# Patient Record
Sex: Female | Born: 1995 | Race: Black or African American | Hispanic: No | Marital: Single | Smoking: Never smoker
Health system: Southern US, Community
[De-identification: ages and names within clinical notes are randomized; demographics above are authoritative.]

## PROBLEM LIST (undated history)

## (undated) ENCOUNTER — Ambulatory Visit: Admission: EM

## (undated) DIAGNOSIS — N946 Dysmenorrhea, unspecified: Secondary | ICD-10-CM

## (undated) HISTORY — PX: NO PAST SURGERIES: SHX2092

## (undated) HISTORY — DX: Dysmenorrhea, unspecified: N94.6

---
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
## 2015-01-07 ENCOUNTER — Emergency Department: Payer: Self-pay | Admitting: Emergency Medicine

## 2015-08-05 ENCOUNTER — Emergency Department: Payer: 59

## 2015-08-05 ENCOUNTER — Encounter: Payer: Self-pay | Admitting: Emergency Medicine

## 2015-08-05 ENCOUNTER — Emergency Department
Admission: EM | Admit: 2015-08-05 | Discharge: 2015-08-05 | Disposition: A | Discharge status: Home / Self Care | Payer: 59 | Attending: Emergency Medicine | Admitting: Emergency Medicine

## 2015-08-05 DIAGNOSIS — M25522 Pain in left elbow: Secondary | ICD-10-CM | POA: Insufficient documentation

## 2015-08-05 DIAGNOSIS — M7712 Lateral epicondylitis, left elbow: Secondary | ICD-10-CM | POA: Insufficient documentation

## 2015-08-05 MED ORDER — MELOXICAM 15 MG PO TABS: 15.0000 mg | ORAL_TABLET | Freq: Every day | ORAL | Status: DC | PRN

## 2015-08-05 NOTE — ED Provider Notes (Signed)
[facility] Emergency Department Provider Note  ____________________________________________  Time seen: Approximately 4:32 PM  I have reviewed the triage vital signs and the nursing notes.   HISTORY  Chief Complaint Arm Pain    HPI Barbara Chapman is a 19 y.o. female presents to the ER for the complaints of left elbow pain. Patient states the elbow has been bothering her intermittently times several weeks but states 2 days increased pain. Patient states the pain is only with movement. Patient states that pain causes her arm to feel like it is tight and hard to straighten. Patient states that she started noticing the last few weeks while at work. Patient states that she works and puts multiple things together with frequent elbow bending and wrist bending and movement throughout the day. Denies fall or known injury. Reports she still has full range of motion but pain with movement. Denies numbness or tingling sensation. States pain is currently 5 out of 10. Aching and throbbing.   History reviewed. No pertinent past medical history.  There are no active problems to display for this patient.   History reviewed. No pertinent past surgical history.  No current outpatient prescriptions on file.  Allergies Review of patient's allergies indicates no known allergies.  No family history on file.  Social History Social History  Substance Use Topics  . Smoking status: Never Smoker   . Smokeless tobacco: None  . Alcohol Use: No    Review of Systems Constitutional: No fever/chills Eyes: No visual changes. ENT: No sore throat. Cardiovascular: Denies chest pain. Respiratory: Denies shortness of breath. Gastrointestinal: No abdominal pain.  No nausea, no vomiting.  No diarrhea.  No constipation. Genitourinary: Negative for dysuria. Musculoskeletal: Negative for back pain. Positive for left elbow pain. Skin: Negative for rash. Neurological: Negative for  headaches, focal weakness or numbness.  10-point ROS otherwise negative.  ____________________________________________   PHYSICAL EXAM:  VITAL SIGNS: ED Triage Vitals  Enc Vitals Group     BP 08/05/15 1620 108/58 mmHg     Pulse Rate 08/05/15 1620 77     Resp 08/05/15 1620 16     Temp 08/05/15 1620 97.8 F (36.6 C)     Temp Source 08/05/15 1620 Oral     SpO2 08/05/15 1620 99 %     Weight 08/05/15 1620 145 lb (65.772 kg)     Height 08/05/15 1620  (1.626 m)     Head Cir --      Peak Flow --      Pain Score 08/05/15 1617 8     Pain Loc --      Pain Edu? --      Excl. in GC? --     Constitutional: Alert and oriented. Well appearing and in no acute distress. Eyes: Conjunctivae are normal. PERRL. EOMI. Head: Atraumatic.  Nose: No congestion/rhinnorhea.  Mouth/Throat: Mucous membranes are moist.  Oropharynx non-erythematous. Neck: No stridor.  No cervical spine tenderness to palpation. Hematological/Lymphatic/Immunilogical: No cervical lymphadenopathy. Cardiovascular: Normal rate, regular rhythm. Grossly normal heart sounds.  Good peripheral circulation. Respiratory: Normal respiratory effort.  No retractions. Lungs CTAB. Gastrointestinal: Soft and nontender. No distention. Normal Bowel sounds.  Musculoskeletal: No lower or upper extremity tenderness nor edema.  No joint effusions. Bilateral pedal pulses equal and easily palpated.  Except: left lateral elbow mild to mod TTP with localized tenderness over the lateral epicondyle, and pain with resisted wrist extension with elbow fully extended.  No swelling or ecchymosis, skin intact. Full ROM.  No pain above elbow, no pain below elbow. Bilateral handgrips equal. Sensation and motor to bilateral upper extremities equal and intact. Neurologic:  Normal speech and language. No gross focal neurologic deficits are appreciated. No gait instability. Skin:  Skin is warm, dry and intact. No rash noted. Psychiatric: Mood and affect are  normal. Speech and behavior are normal.  ____________________________________________   LABS (all labs ordered are listed, but only abnormal results are displayed)  Labs Reviewed - No data to display  RADIOLOGY   EXAM: LEFT ELBOW - COMPLETE 3+ VIEW  COMPARISON: None.  FINDINGS: Four views of the left elbow submitted. No acute fracture or subluxation. No posterior fat pad sign. No radiopaque foreign body.  IMPRESSION: Negative.   Electronically Signed By: Natasha Mead M.D. On: 08/05/2015 17:04  I, Renford Dills, personally viewed and evaluated these images (plain radiographs) as part of my medical decision making.   ____________________________________________   PROCEDURES  Procedure(s) performed:  SPLINT APPLICATION Date/Time: 5:21 PM Authorized by: Renford Dills Consent: Verbal consent obtained. Risks and benefits: risks, benefits and alternatives were discussed Consent given by: patient Splint applied by: edtechnician Location details: left sling Post-procedure: The splinted body part was neurovascularly unchanged following the procedure. Patient tolerance: Patient tolerated the procedure well with no immediate complications.    _______________________   INITIAL IMPRESSION / ASSESSMENT AND PLAN / ED COURSE  Pertinent labs & imaging results that were available during my care of the patient were reviewed by me and considered in my medical decision making (see chart for details).  Very well-appearing patient. No acute distress. Presents to the ER for complaints of left elbow pain. Patient works in a job that requires frequent elbow and forearm movement. Suspect overuse injury. Suspect left lateral epicondylitis. Discussed rest. Ice. Will treat with oral Mobic. Discussed follow-up with orthopedic as needed for continued pain. Discussed return parameters. Patient verbalized understanding and agreed to  plan. ____________________________________________   FINAL CLINICAL IMPRESSION(S) / ED DIAGNOSES  Final diagnoses:  Left elbow pain  Lateral epicondylitis (tennis elbow), left       Renford Dills, NP 08/05/15 1721  Emily Filbert, MD 08/06/15 1400

## 2015-08-05 NOTE — ED Notes (Signed)
Pt comes into the ED c/o left arm pain.  Patient states that she cannot straighten her arm out all the way without the sharp pains shooting through.  Denies any knowledge of injury to the arm.

## 2015-08-05 NOTE — Discharge Instructions (Signed)
Take medication as prescribed. Avoid overly strenuous activity. Stretch arm frequently. Wear sling as needed for support and rest. Alternate heat and ice for comfort.  Follow-up with orthopedic as needed for continued pain. See above. Return to the ER for new or worsening concerns.   Lateral Epicondylitis (Tennis Elbow) with Rehab Lateral epicondylitis involves inflammation and pain around the outer portion of the elbow. The pain is caused by inflammation of the tendons in the forearm that bring back (extend) the wrist. Lateral epicondylitis is also called tennis elbow, because it is very common in tennis players. However, it may occur in any individual who extends the wrist repetitively. If lateral epicondylitis is left untreated, it may become a chronic problem. SYMPTOMS   Pain, tenderness, and inflammation on the outer (lateral) side of the elbow.  Pain or weakness with gripping activities.  Pain that increases with wrist-twisting motions (playing tennis, using a screwdriver, opening a door or a jar).  Pain with lifting objects, including a coffee cup. CAUSES  Lateral epicondylitis is caused by inflammation of the tendons that extend the wrist. Causes of injury may include:  Repetitive stress and strain on the muscles and tendons that extend the wrist.  Sudden change in activity level or intensity.  Incorrect grip in racquet sports.  Incorrect grip size of racquet (often too large).  Incorrect hitting position or technique (usually backhand, leading with the elbow).  Using a racket that is too heavy. RISK INCREASES WITH:  Sports or occupations that require repetitive and/or strenuous forearm and wrist movements (tennis, squash, racquetball, carpentry).  Poor wrist and forearm strength and flexibility.  Failure to warm up properly before activity.  Resuming activity before healing, rehabilitation, and conditioning are complete. PREVENTION   Warm up and stretch properly  before activity.  Maintain physical fitness:  Strength, flexibility, and endurance.  Cardiovascular fitness.  Wear and use properly fitted equipment.  Learn and use proper technique and have a coach correct improper technique.  Wear a tennis elbow (counterforce) brace. PROGNOSIS  The course of this condition depends on the degree of the injury. If treated properly, acute cases (symptoms lasting less than 4 weeks) are often resolved in 2 to 6 weeks. Chronic (longer lasting cases) often resolve in 3 to 6 months but may require physical therapy. RELATED COMPLICATIONS   Frequently recurring symptoms, resulting in a chronic problem. Properly treating the problem the first time decreases frequency of recurrence.  Chronic inflammation, scarring tendon degeneration, and partial tendon tear, requiring surgery.  Delayed healing or resolution of symptoms. TREATMENT  Treatment first involves the use of ice and medicine to reduce pain and inflammation. Strengthening and stretching exercises may help reduce discomfort if performed regularly. These exercises may be performed at home if the condition is an acute injury. Chronic cases may require a referral to a physical therapist for evaluation and treatment. Your caregiver may advise a corticosteroid injection to help reduce inflammation. Rarely, surgery is needed. MEDICATION  If pain medicine is needed, nonsteroidal anti-inflammatory medicines (aspirin and ibuprofen), or other minor pain relievers (acetaminophen), are often advised.  Do not take pain medicine for 7 days before surgery.  Prescription pain relievers may be given, if your caregiver thinks they are needed. Use only as directed and only as much as you need.  Corticosteroid injections may be recommended. These injections should be reserved only for the most severe cases, because they can only be given a certain number of times. HEAT AND COLD  Cold treatment (icing) should  be applied  for 10 to 15 minutes every 2 to 3 hours for inflammation and pain, and immediately after activity that aggravates your symptoms. Use ice packs or an ice massage.  Heat treatment may be used before performing stretching and strengthening activities prescribed by your caregiver, physical therapist, or athletic trainer. Use a heat pack or a warm water soak. SEEK MEDICAL CARE IF: Symptoms get worse or do not improve in 2 weeks, despite treatment. EXERCISES  RANGE OF MOTION (ROM) AND STRETCHING EXERCISES - Epicondylitis, Lateral (Tennis Elbow) These exercises may help you when beginning to rehabilitate your injury. Your symptoms may go away with or without further involvement from your physician, physical therapist, or athletic trainer. While completing these exercises, remember:   Restoring tissue flexibility helps normal motion to return to the joints. This allows healthier, less painful movement and activity.  An effective stretch should be held for at least 30 seconds.  A stretch should never be painful. You should only feel a gentle lengthening or release in the stretched tissue. RANGE OF MOTION - Wrist Flexion, Active-Assisted  Extend your right / left elbow with your fingers pointing down.*  Gently pull the back of your hand towards you, until you feel a gentle stretch on the top of your forearm.  Hold this position for __________ seconds. Repeat __________ times. Complete this exercise __________ times per day.  *If directed by your physician, physical therapist or athletic trainer, complete this stretch with your elbow bent, rather than extended. RANGE OF MOTION - Wrist Extension, Active-Assisted  Extend your right / left elbow and turn your palm upwards.*  Gently pull your palm and fingertips back, so your wrist extends and your fingers point more toward the ground.  You should feel a gentle stretch on the inside of your forearm.  Hold this position for __________ seconds. Repeat  __________ times. Complete this exercise __________ times per day. *If directed by your physician, physical therapist or athletic trainer, complete this stretch with your elbow bent, rather than extended. STRETCH - Wrist Flexion  Place the back of your right / left hand on a tabletop, leaving your elbow slightly bent. Your fingers should point away from your body.  Gently press the back of your hand down onto the table by straightening your elbow. You should feel a stretch on the top of your forearm.  Hold this position for __________ seconds. Repeat __________ times. Complete this stretch __________ times per day.  STRETCH - Wrist Extension   Place your right / left fingertips on a tabletop, leaving your elbow slightly bent. Your fingers should point backwards.  Gently press your fingers and palm down onto the table by straightening your elbow. You should feel a stretch on the inside of your forearm.  Hold this position for __________ seconds. Repeat __________ times. Complete this stretch __________ times per day.  STRENGTHENING EXERCISES - Epicondylitis, Lateral (Tennis Elbow) These exercises may help you when beginning to rehabilitate your injury. They may resolve your symptoms with or without further involvement from your physician, physical therapist, or athletic trainer. While completing these exercises, remember:   Muscles can gain both the endurance and the strength needed for everyday activities through controlled exercises.  Complete these exercises as instructed by your physician, physical therapist or athletic trainer. Increase the resistance and repetitions only as guided.  You may experience muscle soreness or fatigue, but the pain or discomfort you are trying to eliminate should never worsen during these exercises. If this pain  does get worse, stop and make sure you are following the directions exactly. If the pain is still present after adjustments, discontinue the exercise  until you can discuss the trouble with your caregiver. STRENGTH - Wrist Flexors  Sit with your right / left forearm palm-up and fully supported on a table or countertop. Your elbow should be resting below the height of your shoulder. Allow your wrist to extend over the edge of the surface.  Loosely holding a __________ weight, or a piece of rubber exercise band or tubing, slowly curl your hand up toward your forearm.  Hold this position for __________ seconds. Slowly lower the wrist back to the starting position in a controlled manner. Repeat __________ times. Complete this exercise __________ times per day.  STRENGTH - Wrist Extensors  Sit with your right / left forearm palm-down and fully supported on a table or countertop. Your elbow should be resting below the height of your shoulder. Allow your wrist to extend over the edge of the surface.  Loosely holding a __________ weight, or a piece of rubber exercise band or tubing, slowly curl your hand up toward your forearm.  Hold this position for __________ seconds. Slowly lower the wrist back to the starting position in a controlled manner. Repeat __________ times. Complete this exercise __________ times per day.  STRENGTH - Ulnar Deviators  Stand with a ____________________ weight in your right / left hand, or sit while holding a rubber exercise band or tubing, with your healthy arm supported on a table or countertop.  Move your wrist, so that your pinkie travels toward your forearm and your thumb moves away from your forearm.  Hold this position for __________ seconds and then slowly lower the wrist back to the starting position. Repeat __________ times. Complete this exercise __________ times per day STRENGTH - Radial Deviators  Stand with a ____________________ weight in your right / left hand, or sit while holding a rubber exercise band or tubing, with your injured arm supported on a table or countertop.  Raise your hand upward in  front of you or pull up on the rubber tubing.  Hold this position for __________ seconds and then slowly lower the wrist back to the starting position. Repeat __________ times. Complete this exercise __________ times per day. STRENGTH - Forearm Supinators   Sit with your right / left forearm supported on a table, keeping your elbow below shoulder height. Rest your hand over the edge, palm down.  Gently grip a hammer or a soup ladle.  Without moving your elbow, slowly turn your palm and hand upward to a "thumbs-up" position.  Hold this position for __________ seconds. Slowly return to the starting position. Repeat __________ times. Complete this exercise __________ times per day.  STRENGTH - Forearm Pronators   Sit with your right / left forearm supported on a table, keeping your elbow below shoulder height. Rest your hand over the edge, palm up.  Gently grip a hammer or a soup ladle.  Without moving your elbow, slowly turn your palm and hand upward to a "thumbs-up" position.  Hold this position for __________ seconds. Slowly return to the starting position. Repeat __________ times. Complete this exercise __________ times per day.  STRENGTH - Grip  Grasp a tennis ball, a dense sponge, or a large, rolled sock in your hand.  Squeeze as hard as you can, without increasing any pain.  Hold this position for __________ seconds. Release your grip slowly. Repeat __________ times. Complete this exercise  __________ times per day.  STRENGTH - Elbow Extensors, Isometric  Stand or sit upright, on a firm surface. Place your right / left arm so that your palm faces your stomach, and it is at the height of your waist.  Place your opposite hand on the underside of your forearm. Gently push up as your right / left arm resists. Push as hard as you can with both arms, without causing any pain or movement at your right / left elbow. Hold this stationary position for __________ seconds. Gradually  release the tension in both arms. Allow your muscles to relax completely before repeating. Document Released: 12/05/2005 Document Revised: 04/21/2014 Document Reviewed: 03/19/2009 [facility] Patient Information 2015 [place], [place]. This information is not intended to replace advice given to you by your health care provider. Make sure you discuss any questions you have with your health care provider.

## 2015-10-07 ENCOUNTER — Encounter: Payer: Self-pay | Admitting: Family Medicine

## 2015-10-07 ENCOUNTER — Ambulatory Visit (INDEPENDENT_AMBULATORY_CARE_PROVIDER_SITE_OTHER): Payer: 59 | Admitting: Family Medicine

## 2015-10-07 VITALS — BP 110/56 | HR 76 | Temp 98.3°F | Resp 16 | Wt 155.6 lb

## 2015-10-07 DIAGNOSIS — J301 Allergic rhinitis due to pollen: Secondary | ICD-10-CM | POA: Diagnosis not present

## 2015-10-07 DIAGNOSIS — J069 Acute upper respiratory infection, unspecified: Secondary | ICD-10-CM | POA: Diagnosis not present

## 2015-10-07 DIAGNOSIS — N946 Dysmenorrhea, unspecified: Secondary | ICD-10-CM | POA: Diagnosis not present

## 2015-10-07 DIAGNOSIS — J309 Allergic rhinitis, unspecified: Secondary | ICD-10-CM | POA: Insufficient documentation

## 2015-10-07 MED ORDER — NAPROXEN 500 MG PO TABS: 500.0000 mg | ORAL_TABLET | Freq: Two times a day (BID) | ORAL | Status: DC

## 2015-10-07 MED ORDER — HYDROCODONE-HOMATROPINE 5-1.5 MG/5ML PO SYRP: ORAL_SOLUTION | ORAL | Status: DC

## 2015-10-07 NOTE — Patient Instructions (Addendum)
Discussed use of Mucinex D for congestion, Delsym for cough, and Benadryl for postnasal drainage. Work Excuse for 10/19-10/20.

## 2015-10-07 NOTE — Progress Notes (Signed)
Subjective:     Patient ID: Barbara Chapman, female   DOB: 09/18/1996, 19 y.o.   MRN: [id number]  HPI  Chief Complaint  Patient presents with  . Menstrual Problem    Patientm comes in office today to address menstrual cramping, patient states that this is a ongoing issue and has been taking otc Ibuprofen 800mg  with no relief.   . Nasal Congestion    Patient would like to address cold/allergy symptoms for the past two days. Patient reports having cough, congestion and vomiting  Reports she is taking up to 1600 mg of. Ibuprofen at a time to control her cramps. She is accompanied by her partner today.   Review of Systems  Constitutional: Negative for fever and chills.  Genitourinary:       Has been on low dose BCP's in the past for cramps but states they did not help.       Objective:   Physical Exam  Constitutional: She appears well-developed and well-nourished. No distress.  Abdominal: Soft. There is no tenderness (epigastric).  Ears: T.M's intact without inflammation Throat: no tonsillar enlargement or exudate Neck: no cervical adenopathy Lungs: clear     Assessment:    1. Upper respiratory infection - HYDROcodone-homatropine (HYCODAN) 5-1.5 MG/5ML syrup; 5 ml 4-6 hours as needed for cough  Dispense: 240 mL; Refill: 0  2. Menstrual cramps: stop ibuprofen/cautioned about bleeding risk. - naproxen (NAPROSYN) 500 MG tablet; Take 1 tablet (500 mg total) by mouth 2 (two) times daily with a meal. As needed for menstrual cramps  Dispense: 30 tablet; Refill: 1    Plan:    Discussed use of Mucinex D for congestion, Delsym for cough, and Benadryl for postnasal drainage. May call for further rx of cramps as needed esp.trial on different BCP.

## 2016-02-03 ENCOUNTER — Encounter: Payer: Self-pay | Admitting: Family Medicine

## 2016-02-03 ENCOUNTER — Ambulatory Visit (INDEPENDENT_AMBULATORY_CARE_PROVIDER_SITE_OTHER): Payer: BLUE CROSS/BLUE SHIELD | Admitting: Family Medicine

## 2016-02-03 VITALS — BP 110/58 | HR 91 | Temp 97.8°F | Resp 18

## 2016-02-03 DIAGNOSIS — J069 Acute upper respiratory infection, unspecified: Secondary | ICD-10-CM

## 2016-02-03 MED ORDER — FLUTICASONE PROPIONATE 50 MCG/ACT NA SUSP: 2.0000 | Freq: Every day | NASAL | Status: DC

## 2016-02-03 MED ORDER — AMOXICILLIN 500 MG PO CAPS: 1000.0000 mg | ORAL_CAPSULE | Freq: Two times a day (BID) | ORAL | Status: AC

## 2016-02-03 NOTE — Progress Notes (Signed)
Patient: Barbara Chapman Female    DOB: Apr 17, 1996   20 y.o.   MRN: [id number] Visit Date: 02/03/2016  Today's Provider: Mila Merry, MD   Chief Complaint  Patient presents with  . URI   Subjective:    URI  This is a new problem. Episode onset: 3 days ago. The problem has been gradually worsening. There has been no fever. Associated symptoms include congestion (nasal), coughing (productive with green and red sputum), headaches, nausea, rhinorrhea, sinus pain, sneezing and a sore throat (hurts to swallow). Pertinent negatives include no abdominal pain, chest pain, diarrhea, dysuria, ear pain, joint pain, joint swelling, neck pain, plugged ear sensation, rash, swollen glands, vomiting or wheezing. Treatments tried: OTC Cold and flu medication. The treatment provided no relief.  Patient  states it is hard to sleep at night due to nasal congestion.      No Known Allergies Previous Medications   NAPROXEN (NAPROSYN) 500 MG TABLET    Take 1 tablet (500 mg total) by mouth 2 (two) times daily with a meal. As needed for menstrual cramps    Review of Systems  Constitutional: Positive for chills and fatigue. Negative for fever, diaphoresis and appetite change.  HENT: Positive for congestion (nasal), postnasal drip, rhinorrhea, sinus pressure, sneezing and sore throat (hurts to swallow). Negative for ear discharge, ear pain, mouth sores and nosebleeds.   Eyes: Positive for discharge (watery eyes) and itching.  Respiratory: Positive for cough (productive with green and red sputum). Negative for chest tightness, shortness of breath and wheezing.   Cardiovascular: Negative for chest pain and palpitations.  Gastrointestinal: Positive for nausea. Negative for vomiting, abdominal pain and diarrhea.  Genitourinary: Negative for dysuria.  Musculoskeletal: Positive for myalgias. Negative for joint pain and neck pain.  Skin: Negative for rash.  Neurological: Positive for light-headedness and  headaches. Negative for dizziness and weakness.    Social History  Substance Use Topics  . Smoking status: Never Smoker   . Smokeless tobacco: Not on file  . Alcohol Use: No   Objective:   BP 110/58 mmHg  Pulse 91  Temp(Src) 97.8 F (36.6 C) (Oral)  Resp 18  SpO2 100%  Physical Exam  General Appearance:    Alert, cooperative, no distress  HENT:   bilateral TM normal without fluid or infection, neck without nodes, throat normal without erythema or exudate, ethmoid sinus tender, post nasal drip noted and nasal mucosa pale and congested  Eyes:    PERRL, conjunctiva/corneas clear, EOM's intact       Lungs:     Clear to auscultation bilaterally, respirations unlabored  Heart:    Regular rate and rhythm  Neurologic:   Awake, alert, oriented x 3. No apparent focal neurological           defect.           Assessment & Plan:     1. Upper respiratory infection Go ahead and start steroid nose spray Counseled regarding signs and symptoms of viral and bacterial respiratory infections. Advised to start antibiotic if she develops any sign of bacterial infection, or if current symptoms last longer than 10 days.   - fluticasone (FLONASE) 50 MCG/ACT nasal spray; Place 2 sprays into both nostrils daily.  Dispense: 16 g; Refill: 6 - amoxicillin (AMOXIL) 500 MG capsule; Take 2 capsules (1,000 mg total) by mouth 2 (two) times daily.  Dispense: 40 capsule; Refill: 0       Mila Merry, MD  [facility]  [facility]

## 2016-02-03 NOTE — Patient Instructions (Signed)
Fill antibiotic prescription if you develop any fever over 101, or if not feeling better in 4-5 days   Upper Respiratory Infection, Adult Most upper respiratory infections (URIs) are a viral infection of the air passages leading to the lungs. A URI affects the nose, throat, and upper air passages. The most common type of URI is nasopharyngitis and is typically referred to as "the common cold." URIs run their course and usually go away on their own. Most of the time, a URI does not require medical attention, but sometimes a bacterial infection in the upper airways can follow a viral infection. This is called a secondary infection. Sinus and middle ear infections are common types of secondary upper respiratory infections. Bacterial pneumonia can also complicate a URI. A URI can worsen asthma and chronic obstructive pulmonary disease (COPD). Sometimes, these complications can require emergency medical care and may be life threatening.  CAUSES Almost all URIs are caused by viruses. A virus is a type of germ and can spread from one person to another.  RISKS FACTORS You may be at risk for a URI if:   You smoke.   You have chronic heart or lung disease.  You have a weakened defense (immune) system.   You are very young or very old.   You have nasal allergies or asthma.  You work in crowded or poorly ventilated areas.  You work in health care facilities or schools. SIGNS AND SYMPTOMS  Symptoms typically develop 2-3 days after you come in contact with a cold virus. Most viral URIs last 7-10 days. However, viral URIs from the influenza virus (flu virus) can last 14-18 days and are typically more severe. Symptoms may include:   Runny or stuffy (congested) nose.   Sneezing.   Cough.   Sore throat.   Headache.   Fatigue.   Fever.   Loss of appetite.   Pain in your forehead, behind your eyes, and over your cheekbones (sinus pain).  Muscle aches.  DIAGNOSIS  Your health  care provider may diagnose a URI by:  Physical exam.  Tests to check that your symptoms are not due to another condition such as:  Strep throat.  Sinusitis.  Pneumonia.  Asthma. TREATMENT  A URI goes away on its own with time. It cannot be cured with medicines, but medicines may be prescribed or recommended to relieve symptoms. Medicines may help:  Reduce your fever.  Reduce your cough.  Relieve nasal congestion. HOME CARE INSTRUCTIONS   Take medicines only as directed by your health care provider.   Gargle warm saltwater or take cough drops to comfort your throat as directed by your health care provider.  Use a warm mist humidifier or inhale steam from a shower to increase air moisture. This may make it easier to breathe.  Drink enough fluid to keep your urine clear or pale yellow.   Eat soups and other clear broths and maintain good nutrition.   Rest as needed.   Return to work when your temperature has returned to normal or as your health care provider advises. You may need to stay home longer to avoid infecting others. You can also use a face mask and careful hand washing to prevent spread of the virus.  Increase the usage of your inhaler if you have asthma.   Do not use any tobacco products, including cigarettes, chewing tobacco, or electronic cigarettes. If you need help quitting, ask your health care provider. PREVENTION  The best way to protect yourself  from getting a cold is to practice good hygiene.   Avoid oral or hand contact with people with cold symptoms.   Wash your hands often if contact occurs.  There is no clear evidence that vitamin C, vitamin E, echinacea, or exercise reduces the chance of developing a cold. However, it is always recommended to get plenty of rest, exercise, and practice good nutrition.  SEEK MEDICAL CARE IF:   You are getting worse rather than better.   Your symptoms are not controlled by medicine.   You have  chills.  You have worsening shortness of breath.  You have brown or red mucus.  You have yellow or brown nasal discharge.  You have pain in your face, especially when you bend forward.  You have a fever.  You have swollen neck glands.  You have pain while swallowing.  You have white areas in the back of your throat. SEEK IMMEDIATE MEDICAL CARE IF:   You have severe or persistent:  Headache.  Ear pain.  Sinus pain.  Chest pain.  You have chronic lung disease and any of the following:  Wheezing.  Prolonged cough.  Coughing up blood.  A change in your usual mucus.  You have a stiff neck.  You have changes in your:  Vision.  Hearing.  Thinking.  Mood. MAKE SURE YOU:   Understand these instructions.  Will watch your condition.  Will get help right away if you are not doing well or get worse.   This information is not intended to replace advice given to you by your health care provider. Make sure you discuss any questions you have with your health care provider.   Document Released: 05/31/2001 Document Revised: 04/21/2015 Document Reviewed: 03/12/2014 Elsevier Interactive Patient Education [employer].

## 2016-02-29 ENCOUNTER — Ambulatory Visit (INDEPENDENT_AMBULATORY_CARE_PROVIDER_SITE_OTHER): Payer: BLUE CROSS/BLUE SHIELD | Admitting: Family Medicine

## 2016-02-29 ENCOUNTER — Encounter: Payer: Self-pay | Admitting: Family Medicine

## 2016-02-29 VITALS — BP 110/58 | HR 98 | Temp 98.0°F | Resp 16 | Wt 159.0 lb

## 2016-02-29 DIAGNOSIS — N946 Dysmenorrhea, unspecified: Secondary | ICD-10-CM

## 2016-02-29 DIAGNOSIS — J301 Allergic rhinitis due to pollen: Secondary | ICD-10-CM | POA: Diagnosis not present

## 2016-02-29 NOTE — Progress Notes (Signed)
Subjective:     Patient ID: Barbara Chapman, female   DOB: 05/25/1996, 20 y.o.   MRN: [id number]  HPI  Chief Complaint  Patient presents with  . Menstrual Problem    Patient comes in office todaywith concerns of menstrual cramping that began yesteday evening. Patient reports taking Ibuprofen 800mg  , Midol and Tylenol with no relief. Patient complaints when her cycle comes on she also experiences sweating and nausea, patient has tried oral contraception in past to help with menstrual cycle but was unsuccessful  Reports mom has had endometriosis and she wishes gyn referral. Also states she has persistent runny nose of clear drainage. Hx of allergic rhinitis. Accompanied by her girlfriend today.   Review of Systems     Objective:   Physical Exam  Constitutional: She appears well-developed and well-nourished. No distress.  Ears: T.M's intact without inflammation Throat: no tonsillar enlargement or exudate Neck: no cervical adenopathy Lungs: clear     Assessment:    1. Dysmenorrhea - Ambulatory referral to Gynecology  2. Allergic rhinitis due to pollen: Will refill fluticasone spray.    Plan:    Discussed rational use of otc medication for menstrual cramps.

## 2016-02-29 NOTE — Patient Instructions (Signed)
Give the steroid nasal spray at least a week to work. We will call you with the gyn referral. Try ibuprofen 800 mg with 1000 mg.of Tylenol 3 x day for your cramps.

## 2016-03-01 ENCOUNTER — Emergency Department: Payer: BLUE CROSS/BLUE SHIELD

## 2016-03-01 ENCOUNTER — Encounter: Payer: Self-pay | Admitting: Emergency Medicine

## 2016-03-01 ENCOUNTER — Emergency Department
Admission: EM | Admit: 2016-03-01 | Discharge: 2016-03-01 | Disposition: A | Discharge status: Home / Self Care | Payer: BLUE CROSS/BLUE SHIELD | Attending: Emergency Medicine | Admitting: Emergency Medicine

## 2016-03-01 DIAGNOSIS — Z3202 Encounter for pregnancy test, result negative: Secondary | ICD-10-CM | POA: Insufficient documentation

## 2016-03-01 DIAGNOSIS — Z791 Long term (current) use of non-steroidal anti-inflammatories (NSAID): Secondary | ICD-10-CM | POA: Diagnosis not present

## 2016-03-01 DIAGNOSIS — N946 Dysmenorrhea, unspecified: Secondary | ICD-10-CM | POA: Diagnosis not present

## 2016-03-01 DIAGNOSIS — F17210 Nicotine dependence, cigarettes, uncomplicated: Secondary | ICD-10-CM | POA: Insufficient documentation

## 2016-03-01 DIAGNOSIS — R112 Nausea with vomiting, unspecified: Secondary | ICD-10-CM | POA: Insufficient documentation

## 2016-03-01 DIAGNOSIS — R1013 Epigastric pain: Secondary | ICD-10-CM | POA: Diagnosis not present

## 2016-03-01 DIAGNOSIS — R109 Unspecified abdominal pain: Secondary | ICD-10-CM

## 2016-03-01 DIAGNOSIS — H9202 Otalgia, left ear: Secondary | ICD-10-CM | POA: Diagnosis present

## 2016-03-01 DIAGNOSIS — R1011 Right upper quadrant pain: Secondary | ICD-10-CM | POA: Diagnosis not present

## 2016-03-01 LAB — CBC
HEMATOCRIT: 35 % (ref 35.0–47.0)
Hemoglobin: 11.1 g/dL — ABNORMAL LOW (ref 12.0–16.0)
MCH: 23.8 pg — AB (ref 26.0–34.0)
MCHC: 31.8 g/dL — ABNORMAL LOW (ref 32.0–36.0)
MCV: 74.9 fL — AB (ref 80.0–100.0)
PLATELETS: 338 10*3/uL (ref 150–440)
RBC: 4.67 MIL/uL (ref 3.80–5.20)
RDW: 18.9 % — ABNORMAL HIGH (ref 11.5–14.5)
WBC: 11 10*3/uL (ref 3.6–11.0)

## 2016-03-01 LAB — COMPREHENSIVE METABOLIC PANEL
ALT: 24 U/L (ref 14–54)
AST: 28 U/L (ref 15–41)
Albumin: 4.1 g/dL (ref 3.5–5.0)
Alkaline Phosphatase: 98 U/L (ref 38–126)
Anion gap: 7 (ref 5–15)
BILIRUBIN TOTAL: 0.6 mg/dL (ref 0.3–1.2)
BUN: 12 mg/dL (ref 6–20)
CALCIUM: 8.7 mg/dL — AB (ref 8.9–10.3)
CO2: 21 mmol/L — ABNORMAL LOW (ref 22–32)
CREATININE: 0.82 mg/dL (ref 0.44–1.00)
Chloride: 109 mmol/L (ref 101–111)
Glucose, Bld: 90 mg/dL (ref 65–99)
Potassium: 3.4 mmol/L — ABNORMAL LOW (ref 3.5–5.1)
Sodium: 137 mmol/L (ref 135–145)
TOTAL PROTEIN: 8 g/dL (ref 6.5–8.1)

## 2016-03-01 LAB — LIPASE, BLOOD: Lipase: 20 U/L (ref 11–51)

## 2016-03-01 LAB — POCT PREGNANCY, URINE: PREG TEST UR: NEGATIVE

## 2016-03-01 MED ORDER — GI COCKTAIL ~~LOC~~
30.0000 mL | Freq: Once | ORAL | Status: AC
  Administered 2016-03-01: 30 mL via ORAL
  Filled 2016-03-01: qty 30

## 2016-03-01 NOTE — ED Notes (Signed)
Pt presents to ED with left ear pain. Denies drainage from her ear. [facility] same and was told nothing was wrong with the affected ear. Pt states pain has increased. Also reports having menstrual type lower abd cramping with hx of the same. States it is normal for her "but not normal cramping if it was anyone else".

## 2016-03-01 NOTE — ED Notes (Signed)
Pt transported to ultrasound.

## 2016-03-01 NOTE — ED Provider Notes (Signed)
[facility] Emergency Department Provider Note  ____________________________________________  Time seen: Approximately 631 AM  I have reviewed the triage vital signs and the nursing notes.   HISTORY  Chief Complaint Otalgia    HPI Parnika Cheron SchaumannM Heiss is a 20 y.o. female who comes into the hospital today with left ear pain and menstrual cramps. The patient reports that she took some ibuprofen 1600 mg at around 4 AM for her menstrual cramps. She reports an hour later she took some Midol as well. The patient thought the cramps are improved but she developed some upper abdominal pain while she was waiting to be seen. The patient reports that the pain also goes into her back. The left ear pain started this morning as well. The patient was blowing her nose and she felt a sharp pain in her left ear. She reports that the ear pain as a 7 out of 10 in intensity in her abdominal pain is a 10 out of 10. The patient denies any fevers or drainage from her ear she denies any decreased hearing in that side. The patient has had some nausea and vomiting but she reports that that is not uncommon with her menstrual cycle. The patient was initially here due to her ear pain but is moaning in pain due to her abdomen. She has not taken any other medication.   History reviewed. No pertinent past medical history.  Patient Active Problem List   Diagnosis Date Noted  . Menstrual cramps 10/07/2015  . Allergic rhinitis 10/07/2015    Past Surgical History  Procedure Laterality Date  . No past surgeries      Current Outpatient Rx  Name  Route  Sig  Dispense  Refill  . fluticasone (FLONASE) 50 MCG/ACT nasal spray   Each Nare   Place 2 sprays into both nostrils daily. Patient not taking: Reported on 02/29/2016   16 g   6   . naproxen (NAPROSYN) 500 MG tablet   Oral   Take 1 tablet (500 mg total) by mouth 2 (two) times daily with a meal. As needed for menstrual cramps   30 tablet   1      Allergies Review of patient's allergies indicates no known allergies.  No family history on file.  Social History Social History  Substance Use Topics  . Smoking status: Current Every Day Smoker -- 0.50 packs/day    Types: Cigarettes  . Smokeless tobacco: Never Used  . Alcohol Use: No    Review of Systems Constitutional: No fever/chills Eyes: No visual changes. ENT: Ear pain Cardiovascular: Denies chest pain. Respiratory: Denies shortness of breath. Gastrointestinal: abdominal pain with nausea and vomiting Genitourinary: Negative for dysuria. Musculoskeletal: Negative for back pain. Skin: Negative for rash. Neurological: Negative for headaches, focal weakness or numbness.  10-point ROS otherwise negative.  ____________________________________________   PHYSICAL EXAM:  VITAL SIGNS: ED Triage Vitals  Enc Vitals Group     BP 03/01/16 0534 134/67 mmHg     Pulse Rate 03/01/16 0534 77     Resp 03/01/16 0534 22     Temp 03/01/16 0534 97.7 F (36.5 C)     Temp Source 03/01/16 0534 Oral     SpO2 03/01/16 0534 97 %     Weight 03/01/16 0534 159 lb (72.122 kg)     Height 03/01/16 0534 5\' 5"  (1.651 m)     Head Cir --      Peak Flow --      Pain Score 03/01/16  0534 7     Pain Loc --      Pain Edu? --      Excl. in GC? --     Constitutional: Alert and oriented. Well appearing and in moderate distress. Eyes: Conjunctivae are normal. PERRL. EOMI. Ears: TMs gray flat and dull without any effusion or erythema Head: Atraumatic. Nose: No congestion/rhinnorhea. Mouth/Throat: Mucous membranes are moist.  Oropharynx non-erythematous. Cardiovascular: Normal rate, regular rhythm. Grossly normal heart sounds.  Good peripheral circulation. Respiratory: Normal respiratory effort.  No retractions. Lungs CTAB. Gastrointestinal: Soft with epigastric and right upper quadrant abdominal pain to palpation. No distention. Positive bowel sounds Musculoskeletal: No lower extremity  tenderness nor edema.   Neurologic:  Normal speech and language.  Skin:  Skin is warm, dry and intact.  Psychiatric: Mood and affect are normal.   ____________________________________________   LABS (all labs ordered are listed, but only abnormal results are displayed)  Labs Reviewed  CBC - Abnormal; Notable for the following:    Hemoglobin 11.1 (*)    MCV 74.9 (*)    MCH 23.8 (*)    MCHC 31.8 (*)    RDW 18.9 (*)    All other components within normal limits  COMPREHENSIVE METABOLIC PANEL - Abnormal; Notable for the following:    Potassium 3.4 (*)    CO2 21 (*)    Calcium 8.7 (*)    All other components within normal limits  LIPASE, BLOOD  POC URINE PREG, ED   ____________________________________________  EKG  None ____________________________________________  RADIOLOGY  Right upper quadrant ultrasound Chest x-ray ____________________________________________   PROCEDURES  Procedure(s) performed: None  Critical Care performed: No  ____________________________________________   INITIAL IMPRESSION / ASSESSMENT AND PLAN / ED COURSE  Pertinent labs & imaging results that were available during my care of the patient were reviewed by me and considered in my medical decision making (see chart for details).  This is a 20 year old female who comes into the hospital today initially for ear pain on the left side. All the patient was rating she developed some epigastric pain. Given the patient's recent intake of 1600 mg of ibuprofen and concern for possible ulcer versus gastritis versus perforation. I will do some blood work as well as give the patient a GI cocktail. I will also do an upright chest x-ray looking for free air. I may order a right upper quadrant ultrasound that the pain persists.  The patient's care will be signed out to Dr. Fanny Bien who will follow up the results of the xray and the ultrasound and disposition the  patient. ____________________________________________   FINAL CLINICAL IMPRESSION(S) / ED DIAGNOSES  Final diagnoses:  Abdominal pain  Otalgia of left ear      Rebecka Apley, MD 03/01/16 [phone]

## 2016-03-01 NOTE — ED Provider Notes (Signed)
Patient awake alert currently reports feeling well. No longer having any abdominal discomfort. Left ear atraumatic normal tympanic membrane. Oropharynx normal no tender teeth or abscess noted. Patient's abdomen soft nontender. She is fully awake and alert 4.  Did discuss careful use of over-the-counter medicines and not overusing, never more than directed on the packaging.  Patient will follow up outpatient primary care.  Return precautions and treatment recommendations and follow-up discussed with the patient who is agreeable with the plan.   Sharyn CreamerMark Quale, MD 03/01/16 1026

## 2016-03-02 ENCOUNTER — Other Ambulatory Visit: Payer: Self-pay | Admitting: Family Medicine

## 2016-03-02 ENCOUNTER — Telehealth: Payer: Self-pay | Admitting: Family Medicine

## 2016-03-10 ENCOUNTER — Encounter: Payer: BLUE CROSS/BLUE SHIELD | Admitting: Obstetrics and Gynecology

## 2016-03-31 ENCOUNTER — Encounter: Payer: BLUE CROSS/BLUE SHIELD | Admitting: Obstetrics and Gynecology

## 2016-04-21 ENCOUNTER — Encounter: Payer: BLUE CROSS/BLUE SHIELD | Admitting: Obstetrics and Gynecology

## 2016-05-24 ENCOUNTER — Encounter: Payer: BLUE CROSS/BLUE SHIELD | Admitting: Obstetrics and Gynecology

## 2016-06-16 ENCOUNTER — Emergency Department
Admission: EM | Admit: 2016-06-16 | Discharge: 2016-06-16 | Disposition: A | Discharge status: Home / Self Care | Payer: BLUE CROSS/BLUE SHIELD | Attending: Emergency Medicine | Admitting: Emergency Medicine

## 2016-06-16 DIAGNOSIS — N946 Dysmenorrhea, unspecified: Secondary | ICD-10-CM | POA: Diagnosis not present

## 2016-06-16 DIAGNOSIS — F17210 Nicotine dependence, cigarettes, uncomplicated: Secondary | ICD-10-CM | POA: Diagnosis not present

## 2016-06-16 DIAGNOSIS — Z7951 Long term (current) use of inhaled steroids: Secondary | ICD-10-CM | POA: Diagnosis not present

## 2016-06-16 DIAGNOSIS — N926 Irregular menstruation, unspecified: Secondary | ICD-10-CM | POA: Diagnosis present

## 2016-06-16 LAB — URINALYSIS COMPLETE WITH MICROSCOPIC (ARMC ONLY)
Bilirubin Urine: NEGATIVE
Glucose, UA: NEGATIVE mg/dL
KETONES UR: NEGATIVE mg/dL
Nitrite: NEGATIVE
PH: 8 (ref 5.0–8.0)
PROTEIN: 30 mg/dL — AB
Specific Gravity, Urine: 1.018 (ref 1.005–1.030)

## 2016-06-16 LAB — CBC
HCT: 36.7 % (ref 35.0–47.0)
HEMOGLOBIN: 11.5 g/dL — AB (ref 12.0–16.0)
MCH: 23 pg — AB (ref 26.0–34.0)
MCHC: 31.5 g/dL — AB (ref 32.0–36.0)
MCV: 73.1 fL — AB (ref 80.0–100.0)
Platelets: 399 10*3/uL (ref 150–440)
RBC: 5.02 MIL/uL (ref 3.80–5.20)
RDW: 18.9 % — ABNORMAL HIGH (ref 11.5–14.5)
WBC: 10.6 10*3/uL (ref 3.6–11.0)

## 2016-06-16 LAB — COMPREHENSIVE METABOLIC PANEL
ALBUMIN: 4.3 g/dL (ref 3.5–5.0)
ALK PHOS: 100 U/L (ref 38–126)
ALT: 33 U/L (ref 14–54)
ANION GAP: 7 (ref 5–15)
AST: 29 U/L (ref 15–41)
BUN: 8 mg/dL (ref 6–20)
CALCIUM: 8.9 mg/dL (ref 8.9–10.3)
CHLORIDE: 105 mmol/L (ref 101–111)
CO2: 25 mmol/L (ref 22–32)
Creatinine, Ser: 0.82 mg/dL (ref 0.44–1.00)
GFR calc non Af Amer: >60 mL/min (ref >60)
GLUCOSE: 114 mg/dL — AB (ref 65–99)
Potassium: 3.5 mmol/L (ref 3.5–5.1)
SODIUM: 137 mmol/L (ref 135–145)
Total Bilirubin: 0.5 mg/dL (ref 0.3–1.2)
Total Protein: 8 g/dL (ref 6.5–8.1)

## 2016-06-16 LAB — POCT PREGNANCY, URINE: PREG TEST UR: NEGATIVE

## 2016-06-16 LAB — LIPASE, BLOOD: LIPASE: 20 U/L (ref 11–51)

## 2016-06-16 MED ORDER — HYDROMORPHONE HCL 1 MG/ML IJ SOLN
1.0000 mg | Freq: Once | INTRAMUSCULAR | Status: AC
  Administered 2016-06-16: 1 mg via INTRAMUSCULAR
  Filled 2016-06-16: qty 1

## 2016-06-16 MED ORDER — ONDANSETRON 4 MG PO TBDP
ORAL_TABLET | ORAL | Status: AC
  Filled 2016-06-16: qty 1

## 2016-06-16 MED ORDER — KETOROLAC TROMETHAMINE 60 MG/2ML IM SOLN
INTRAMUSCULAR | Status: AC
  Filled 2016-06-16: qty 2

## 2016-06-16 MED ORDER — ONDANSETRON 4 MG PO TBDP
4.0000 mg | ORAL_TABLET | Freq: Once | ORAL | Status: AC
  Administered 2016-06-16: 4 mg via ORAL

## 2016-06-16 MED ORDER — KETOROLAC TROMETHAMINE 60 MG/2ML IM SOLN
60.0000 mg | Freq: Once | INTRAMUSCULAR | Status: AC
  Administered 2016-06-16: 60 mg via INTRAMUSCULAR

## 2016-06-16 NOTE — ED Provider Notes (Signed)
[facility] Emergency Department Provider Note   ____________________________________________  Time seen: Approximately 4 PM  I have reviewed the triage vital signs and the nursing notes.   HISTORY  Chief Complaint Menstrual Problem   HPI Barbara Chapman is a 20 y.o. female with a history of painful menses since she's been 14 was presenting to the emergency department today. She says that she comes to the emergency Department almost every month for her menses. She says that she has been evaluated in the past by OB/GYN and was tried on birth control for control of her pain but said that this did not work. She says that she took 2 800 mg ibuprofen at home and did not have any relief. She received Dilaudid as well as Toradol in triage and now her pain is relieved. She says that the pain was suprapubic and cramping. No radiation. Says that she also vomited once which is also typical for her periods. Says that she only has light spotting. Denies any discharge. No concern for STDs. York SpanielSaid that she called [facility] and they did not have a same-day appointment today. Said that she also: [facility] and they did not pick up the phone. She therefore decided to come in the emergency department for further evaluation. She says that her mother has a history of endometriosis and she is concerned she may have the same.   History reviewed. No pertinent past medical history.  Patient Active Problem List   Diagnosis Date Noted  . Menstrual cramps 10/07/2015  . Allergic rhinitis 10/07/2015    Past Surgical History  Procedure Laterality Date  . No past surgeries      Current Outpatient Rx  Name  Route  Sig  Dispense  Refill  . fluticasone (FLONASE) 50 MCG/ACT nasal spray   Each Nare   Place 2 sprays into both nostrils daily. Patient not taking: Reported on 02/29/2016   16 g   6   . ibuprofen (ADVIL,MOTRIN) 800 MG tablet      TAKE ONE TABLET BY MOUTH TWO TO THREE  TIMES DAILY WITH A MEAL AS NEEDED FOR MENSTRUAL CRAMP   30 tablet   0     May take with acetaminophen if needed.   . naproxen (NAPROSYN) 500 MG tablet   Oral   Take 1 tablet (500 mg total) by mouth 2 (two) times daily with a meal. As needed for menstrual cramps   30 tablet   1     Allergies Review of patient's allergies indicates no known allergies.  No family history on file.  Social History Social History  Substance Use Topics  . Smoking status: Current Every Day Smoker -- 0.50 packs/day    Types: Cigarettes  . Smokeless tobacco: Never Used  . Alcohol Use: No    Review of Systems Constitutional: No fever/chills Eyes: No visual changes. ENT: No sore throat. Cardiovascular: Denies chest pain. Respiratory: Denies shortness of breath. Gastrointestinal: no vomiting.  No diarrhea.  No constipation. Genitourinary: Negative for dysuria. Musculoskeletal: Negative for back pain. Skin: Negative for rash. Neurological: Negative for headaches, focal weakness or numbness.  10-point ROS otherwise negative.  ____________________________________________   PHYSICAL EXAM:  VITAL SIGNS: ED Triage Vitals  Enc Vitals Group     BP 06/16/16 1357 155/104 mmHg     Pulse Rate 06/16/16 1357 77     Resp 06/16/16 1357 18     Temp 06/16/16 1357 97.9 F (36.6 C)     Temp  Source 06/16/16 1357 Oral     SpO2 06/16/16 1357 100 %     Weight 06/16/16 1357 160 lb (72.576 kg)     Height 06/16/16 1357 5\' 5"  (1.651 m)     Head Cir --      Peak Flow --      Pain Score 06/16/16 1401 10     Pain Loc --      Pain Edu? --      Excl. in GC? --     Constitutional: Alert and oriented. Well appearing and in no acute distress. Eyes: Conjunctivae are normal. PERRL. EOMI. Head: Atraumatic. Nose: No congestion/rhinnorhea. Mouth/Throat: Mucous membranes are moist.   Neck: No stridor.   Cardiovascular: Normal rate, regular rhythm. Grossly normal heart sounds.  Respiratory: Normal respiratory  effort.  No retractions. Lungs CTAB. Gastrointestinal: Soft and nontender. No distention. No CVA tenderness. Musculoskeletal: No lower extremity tenderness nor edema.  No joint effusions. Neurologic:  Normal speech and language. No gross focal neurologic deficits are appreciated.  Skin:  Skin is warm, dry and intact. No rash noted. Psychiatric: Mood and affect are normal. Speech and behavior are normal.  ____________________________________________   LABS (all labs ordered are listed, but only abnormal results are displayed)  Labs Reviewed  COMPREHENSIVE METABOLIC PANEL - Abnormal; Notable for the following:    Glucose, Bld 114 (*)    All other components within normal limits  CBC - Abnormal; Notable for the following:    Hemoglobin 11.5 (*)    MCV 73.1 (*)    MCH 23.0 (*)    MCHC 31.5 (*)    RDW 18.9 (*)    All other components within normal limits  LIPASE, BLOOD  URINALYSIS COMPLETEWITH MICROSCOPIC ([facility] ONLY)  POC URINE PREG, ED  POCT PREGNANCY, URINE   ____________________________________________  EKG   ____________________________________________  RADIOLOGY   ____________________________________________   PROCEDURES   ____________________________________________   INITIAL IMPRESSION / ASSESSMENT AND PLAN / ED COURSE  Pertinent labs & imaging results that were available during my care of the patient were reviewed by me and considered in my medical decision making (see chart for details).  With very reassuring blood work. Comfortable at the time of my exam. We discussed following up with OB/GYN for this chronic issue. She understands that in the emergency department very limited history with chronic issue such as this. I believe that her pain is caused by ongoing painful menses. 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 I realize that this is fairly normal but the patient has been having similar symptoms and she has been 20 years old.  She says it is consistent with her periods and that it is unchanged in its character. I believe that an outpatient workup is appropriate at this time. Very reassuring labs. Stable and mild anemia. The patient will be [facility]. I discussed specifically that if they are unable to give her same-day appointment that she should try to schedule an appointment within the next few weeks. She is understanding of the plan and willing to comply. We also discussed not taking too much ibuprofen. She says that she will limit her ibuprofen intake to one ibuprofen at a time.   ____________________________________________   FINAL CLINICAL IMPRESSION(S) / ED DIAGNOSES  Dysmenorrhea.    NEW MEDICATIONS STARTED DURING THIS VISIT:  New Prescriptions   No medications on file     Note:  This document was prepared using Dragon voice  recognition software and may include unintentional dictation errors.    Myrna Blazer, MD 06/16/16 [phone]

## 2016-06-16 NOTE — ED Notes (Signed)
Pt arrives to ER via POV c/o menstrual cramps; pt states that her period started this AM. Pt states that menstrual cramp are always this bad, denies this one being abnormal. Pt states that nothing is usually done for her pain. .Pt shaking, RR even, color WNL.

## 2016-06-16 NOTE — Discharge Instructions (Signed)

## 2016-06-16 NOTE — ED Notes (Signed)
Pt in via triage with complaints of severe menstrual cramping since this morning.  Pt reports starting her menstrual cycle this morning.  Pt with nausea/vomiting x 2 today.  Pt A/Ox4, no immediate distress at this time.

## 2016-07-14 IMAGING — US US ABDOMEN LIMITED
1 series · 14 of 25 positions shown · non-contrast
Comparison: None.

CLINICAL DATA: Right upper quadrant pain for 1 day

EXAM:
US ABDOMEN LIMITED - RIGHT UPPER QUADRANT

[Series 1: us abdomen limited · 0.19mm/px · 14 of 35 slices shown]
[im 1/35]
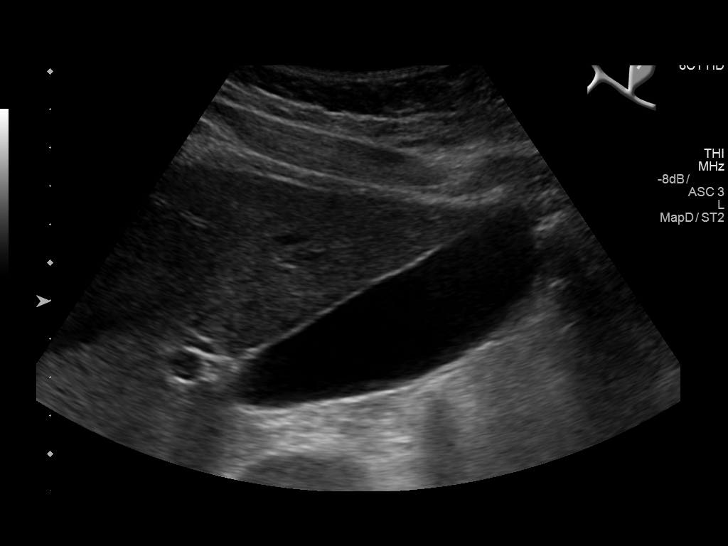
[im 3/35]
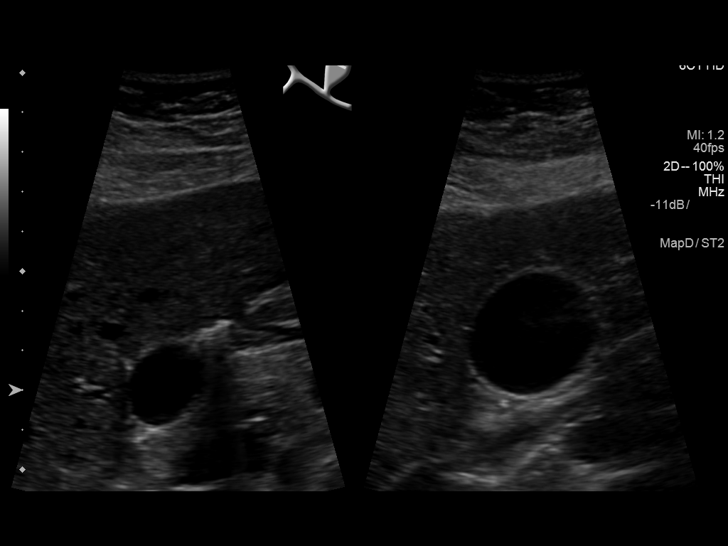
[im 6/35]
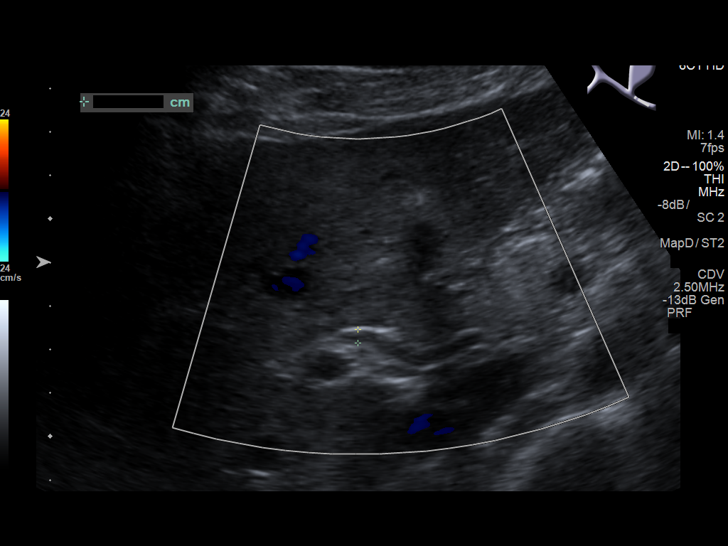
[im 9/35]
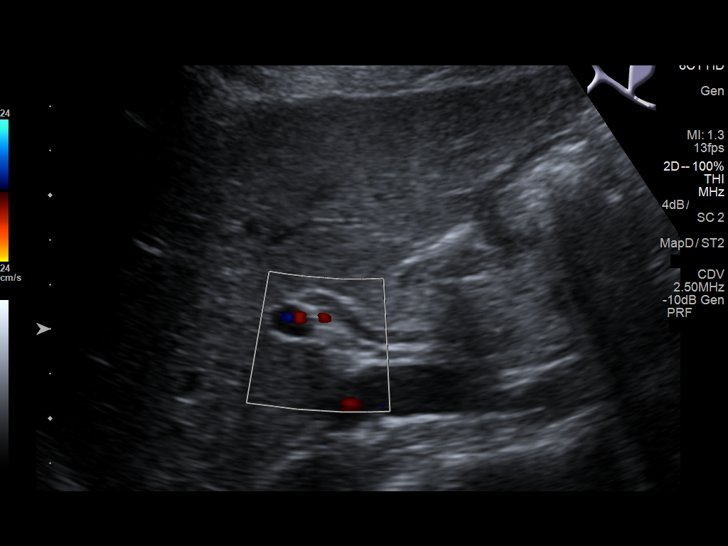
[im 12/35]
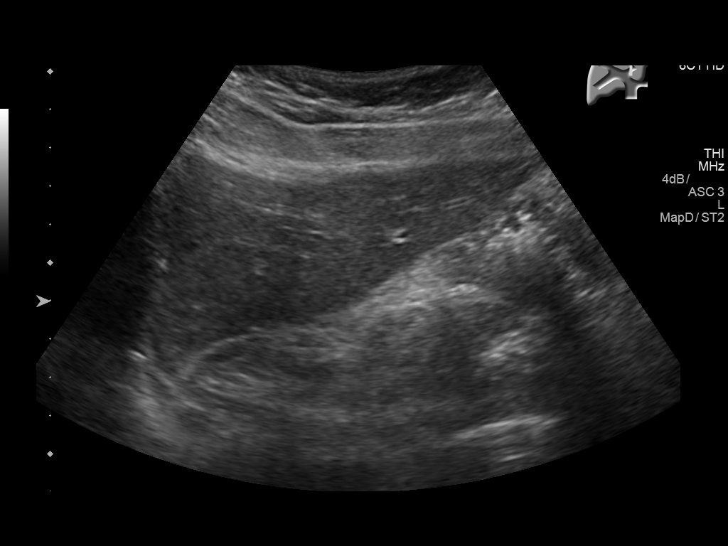
[im 13/35]
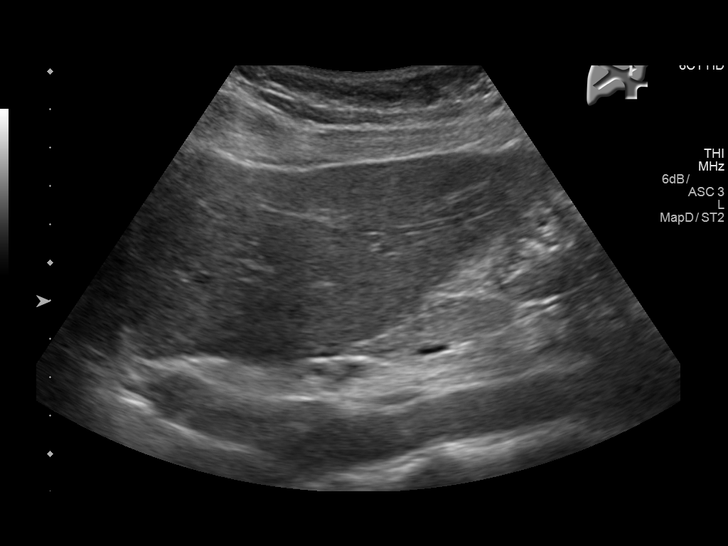
[im 16/35]
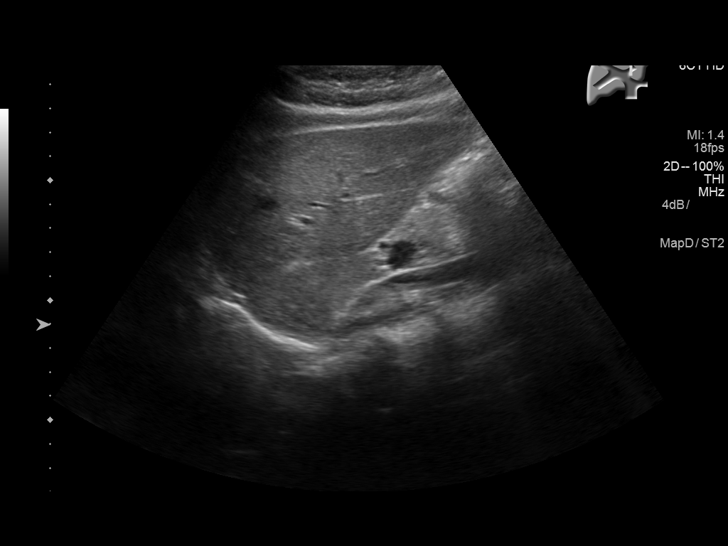
[im 19/35]
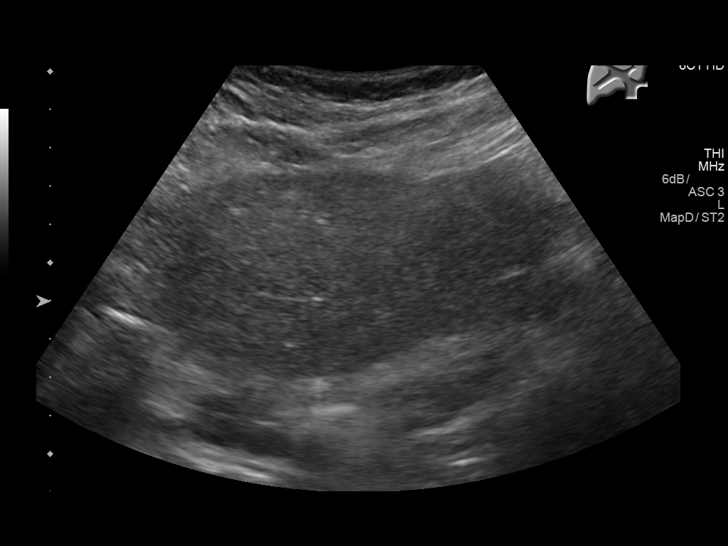
[im 22/35]
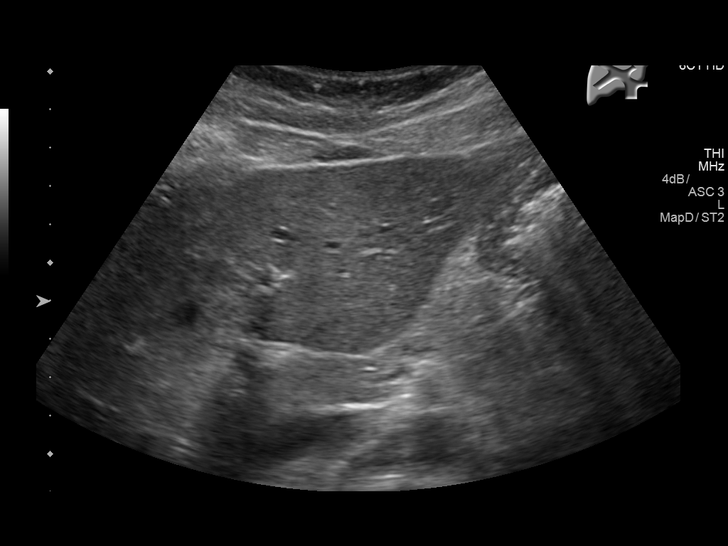
[im 23/35]
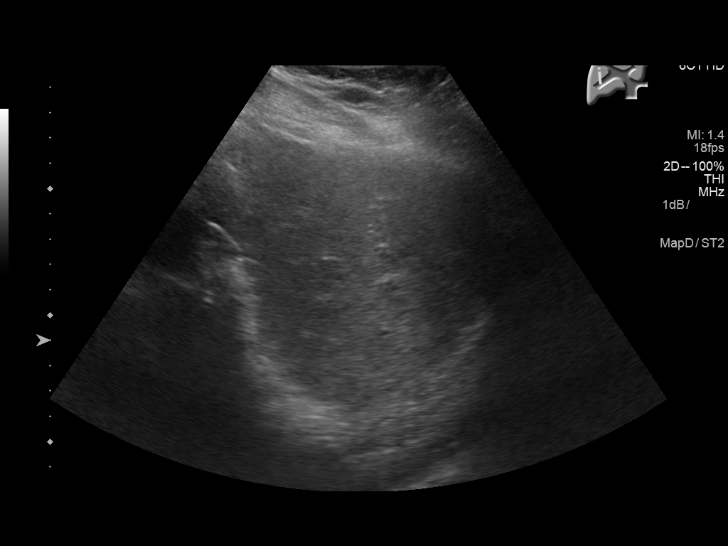
[im 26/35]
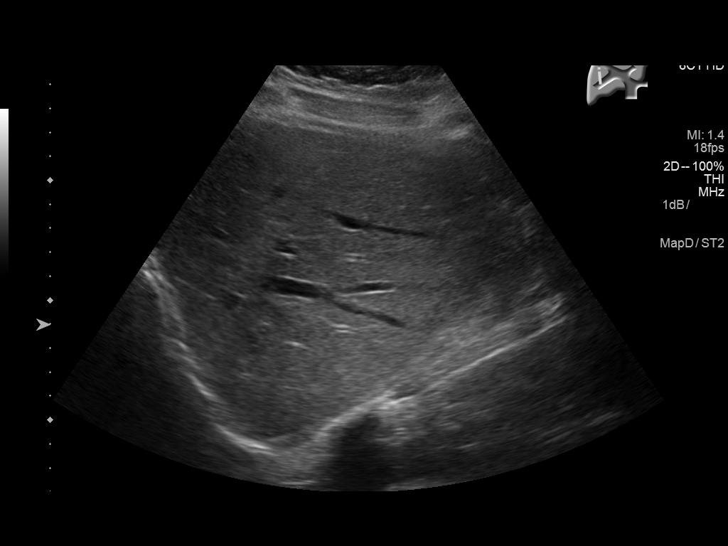
[im 29/35]
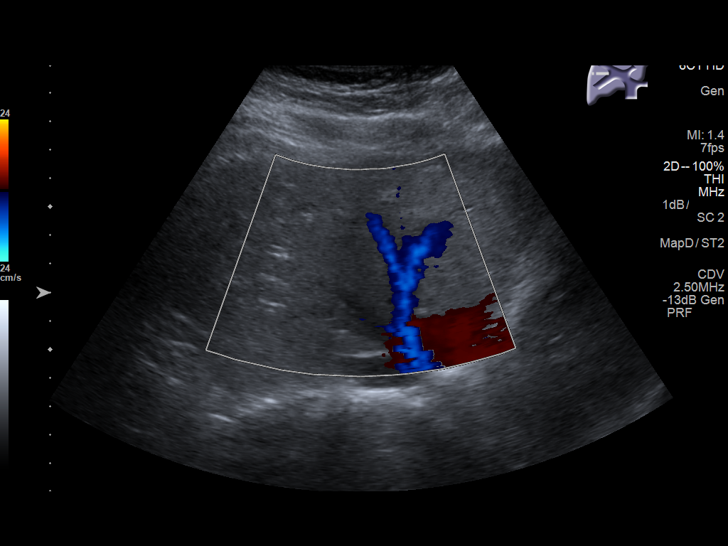
[im 32/35]
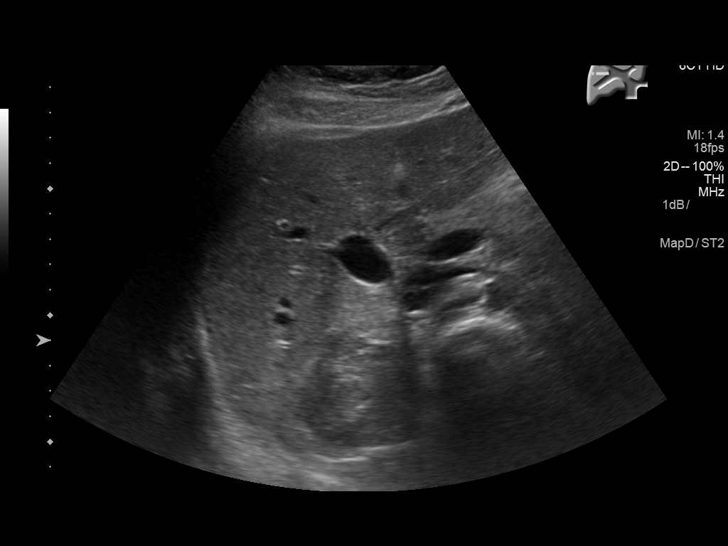
[im 35/35]
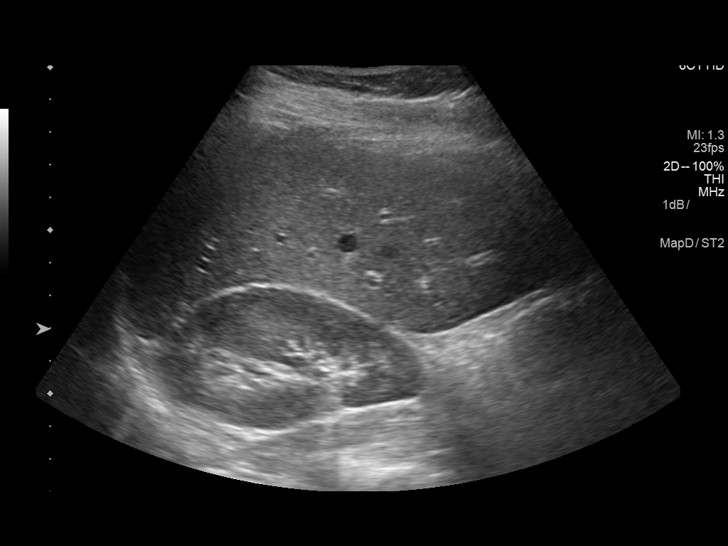

[14 of 25 positions shown; findings below may reference images not displayed]

FINDINGS: Gallbladder:

No gallstones or wall thickening visualized. No sonographic Murphy
sign noted by sonographer.

Common bile duct:

Diameter: 3.1 mm.

Liver:

No focal lesion identified. Within normal limits in parenchymal
echogenicity.
IMPRESSION: No acute abnormality noted.

## 2016-07-18 ENCOUNTER — Ambulatory Visit (INDEPENDENT_AMBULATORY_CARE_PROVIDER_SITE_OTHER): Payer: BLUE CROSS/BLUE SHIELD | Admitting: Family Medicine

## 2016-07-18 ENCOUNTER — Encounter: Payer: Self-pay | Admitting: Family Medicine

## 2016-07-18 VITALS — BP 110/60 | HR 80 | Temp 97.7°F | Resp 16 | Wt 154.6 lb

## 2016-07-18 DIAGNOSIS — K529 Noninfective gastroenteritis and colitis, unspecified: Secondary | ICD-10-CM | POA: Diagnosis not present
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
## 2016-07-18 NOTE — Patient Instructions (Addendum)
Continue with Gatorade and eat as tolerated. May use imodium for diarrhea as needed. Encourage scheduling with gyn for your painful periods.

## 2016-07-27 NOTE — Telephone Encounter (Signed)
error 

## 2016-10-09 ENCOUNTER — Other Ambulatory Visit: Payer: Self-pay | Admitting: Family Medicine

## 2016-11-08 DIAGNOSIS — T23242A Burn of second degree of multiple left fingers (nail), including thumb, initial encounter: Secondary | ICD-10-CM | POA: Insufficient documentation

## 2016-11-08 DIAGNOSIS — Y9389 Activity, other specified: Secondary | ICD-10-CM | POA: Insufficient documentation

## 2016-11-08 DIAGNOSIS — T22251A Burn of second degree of right shoulder, initial encounter: Secondary | ICD-10-CM | POA: Diagnosis not present

## 2016-11-08 DIAGNOSIS — X088XXA Exposure to other specified smoke, fire and flames, initial encounter: Secondary | ICD-10-CM | POA: Insufficient documentation

## 2016-11-08 DIAGNOSIS — Y929 Unspecified place or not applicable: Secondary | ICD-10-CM | POA: Diagnosis not present

## 2016-11-08 DIAGNOSIS — Z791 Long term (current) use of non-steroidal anti-inflammatories (NSAID): Secondary | ICD-10-CM | POA: Diagnosis not present

## 2016-11-08 DIAGNOSIS — T25211A Burn of second degree of right ankle, initial encounter: Secondary | ICD-10-CM | POA: Insufficient documentation

## 2016-11-08 DIAGNOSIS — F17210 Nicotine dependence, cigarettes, uncomplicated: Secondary | ICD-10-CM | POA: Diagnosis not present

## 2016-11-08 DIAGNOSIS — T2027XA Burn of second degree of neck, initial encounter: Secondary | ICD-10-CM | POA: Diagnosis not present

## 2016-11-08 DIAGNOSIS — Y999 Unspecified external cause status: Secondary | ICD-10-CM | POA: Diagnosis not present

## 2016-11-08 DIAGNOSIS — T25011A Burn of unspecified degree of right ankle, initial encounter: Secondary | ICD-10-CM | POA: Diagnosis present
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
## 2016-11-09 ENCOUNTER — Encounter: Payer: Self-pay | Admitting: Emergency Medicine

## 2016-11-09 ENCOUNTER — Emergency Department
Admission: EM | Admit: 2016-11-09 | Discharge: 2016-11-09 | Disposition: A | Discharge status: Home / Self Care | Payer: BLUE CROSS/BLUE SHIELD | Attending: Emergency Medicine | Admitting: Emergency Medicine

## 2016-11-09 DIAGNOSIS — T300 Burn of unspecified body region, unspecified degree: Secondary | ICD-10-CM

## 2016-11-09 MED ORDER — TRAMADOL HCL 50 MG PO TABS: 50.0000 mg | ORAL_TABLET | Freq: Four times a day (QID) | ORAL | 0 refills | Status: DC | PRN

## 2016-11-09 MED ORDER — SILVER SULFADIAZINE 1 % EX CREA
TOPICAL_CREAM | CUTANEOUS | 0 refills | Status: DC
Start: 2016-11-09 — End: 2016-12-05

## 2016-11-09 MED ORDER — IBUPROFEN 800 MG PO TABS: 800.0000 mg | ORAL_TABLET | Freq: Three times a day (TID) | ORAL | 0 refills | Status: DC | PRN

## 2016-11-09 MED ORDER — TRAMADOL HCL 50 MG PO TABS
50.0000 mg | ORAL_TABLET | Freq: Once | ORAL | Status: AC
  Administered 2016-11-09: 50 mg via ORAL
  Filled 2016-11-09: qty 1

## 2016-11-09 MED ORDER — IBUPROFEN 800 MG PO TABS
800.0000 mg | ORAL_TABLET | Freq: Once | ORAL | Status: AC
  Administered 2016-11-09: 800 mg via ORAL
  Filled 2016-11-09: qty 1

## 2016-11-09 MED ORDER — SILVER SULFADIAZINE 1 % EX CREA
TOPICAL_CREAM | Freq: Once | CUTANEOUS | Status: AC
  Administered 2016-11-09: 1 via TOPICAL
  Filled 2016-11-09: qty 85

## 2016-11-09 NOTE — ED Notes (Signed)
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
## 2016-11-15 DIAGNOSIS — T22051A Burn of unspecified degree of right shoulder, initial encounter: Secondary | ICD-10-CM | POA: Insufficient documentation

## 2016-11-15 DIAGNOSIS — T25211A Burn of second degree of right ankle, initial encounter: Secondary | ICD-10-CM | POA: Insufficient documentation

## 2016-12-05 ENCOUNTER — Ambulatory Visit (INDEPENDENT_AMBULATORY_CARE_PROVIDER_SITE_OTHER): Payer: BLUE CROSS/BLUE SHIELD | Admitting: Family Medicine

## 2016-12-05 ENCOUNTER — Encounter: Payer: Self-pay | Admitting: Family Medicine

## 2016-12-05 VITALS — BP 106/80 | HR 82 | Temp 98.2°F | Resp 16 | Wt 150.8 lb

## 2016-12-05 DIAGNOSIS — S300XXD Contusion of lower back and pelvis, subsequent encounter: Secondary | ICD-10-CM | POA: Diagnosis not present

## 2016-12-05 NOTE — Patient Instructions (Signed)
Discussed warm bathtub soaks or heating pad daily for 10-20 minutes.
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
## 2016-12-16 ENCOUNTER — Telehealth: Payer: Self-pay

## 2016-12-16 NOTE — Telephone Encounter (Signed)
Patient advised as below. Patient verbalizes understanding and is in agreement with treatment plan.  

## 2016-12-16 NOTE — Telephone Encounter (Signed)
Patient reports cough and cold symptoms since Sunday. Patient reports symptoms are worsening especially at night time. Patient has been taking Dyquil and Nyquil. Patient reports sore throat, headache, runny nose,coughing upphlegm with some blood in it. Patient denies fever and reports that her partner is also sick with same symptoms.  Please advise. Thank you. CB# [id number]. sd

## 2016-12-16 NOTE — Telephone Encounter (Signed)
Have her take Mucinex D (the one you sign for) and Delsym for cough. Would like to see her early next week if not improving.

## 2016-12-20 ENCOUNTER — Emergency Department
Admission: EM | Admit: 2016-12-20 | Discharge: 2016-12-20 | Disposition: A | Discharge status: Home / Self Care | Payer: BLUE CROSS/BLUE SHIELD | Attending: Emergency Medicine | Admitting: Emergency Medicine

## 2016-12-20 ENCOUNTER — Encounter: Payer: Self-pay | Admitting: Emergency Medicine

## 2016-12-20 DIAGNOSIS — Z791 Long term (current) use of non-steroidal anti-inflammatories (NSAID): Secondary | ICD-10-CM | POA: Insufficient documentation

## 2016-12-20 DIAGNOSIS — F17210 Nicotine dependence, cigarettes, uncomplicated: Secondary | ICD-10-CM | POA: Diagnosis not present

## 2016-12-20 DIAGNOSIS — N946 Dysmenorrhea, unspecified: Secondary | ICD-10-CM | POA: Diagnosis present

## 2016-12-20 MED ORDER — KETOROLAC TROMETHAMINE 10 MG PO TABS: 10.0000 mg | ORAL_TABLET | Freq: Three times a day (TID) | ORAL | 0 refills | Status: DC

## 2016-12-20 MED ORDER — ONDANSETRON 4 MG PO TBDP
4.0000 mg | ORAL_TABLET | Freq: Once | ORAL | Status: AC | PRN
  Administered 2016-12-20: 4 mg via ORAL

## 2016-12-20 MED ORDER — ONDANSETRON 4 MG PO TBDP
ORAL_TABLET | ORAL | Status: AC
  Filled 2016-12-20: qty 1

## 2016-12-20 MED ORDER — ONDANSETRON 4 MG PO TBDP: 4.0000 mg | ORAL_TABLET | Freq: Four times a day (QID) | ORAL | 0 refills | Status: DC | PRN

## 2016-12-20 MED ORDER — BUTALBITAL-APAP-CAFFEINE 50-325-40 MG PO TABS
1.0000 | ORAL_TABLET | Freq: Once | ORAL | Status: AC
  Administered 2016-12-20: 1 via ORAL
  Filled 2016-12-20: qty 1

## 2016-12-20 MED ORDER — KETOROLAC TROMETHAMINE 60 MG/2ML IM SOLN
30.0000 mg | Freq: Once | INTRAMUSCULAR | Status: AC
  Administered 2016-12-20: 30 mg via INTRAMUSCULAR
  Filled 2016-12-20: qty 2

## 2016-12-20 NOTE — ED Notes (Signed)
Pt reports that she is having severe pain with menstrual cramping - c/o feeling dizzy - nausea and vomiting (4-5 times in 24 hours)

## 2016-12-20 NOTE — Discharge Instructions (Signed)
Take the Toradol as directed for the next 5 days. Use the nausea medicine as needed. Start an OTC Midol or Pamprin in addition for menstrual pain relief. Follow-up with your GYN provider as planned.

## 2016-12-20 NOTE — ED Triage Notes (Signed)
Patient present to ED via POV with c/o dysmenorrhea starting today. Patient states she has had 5 episodes of emesis today. Patient states this happens every month and is followed closely by her OBGYN but they were unable to get her in for an appointment today. Patient crying in triage.
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
## 2017-02-14 ENCOUNTER — Emergency Department
Admission: EM | Admit: 2017-02-14 | Discharge: 2017-02-14 | Disposition: A | Discharge status: Home / Self Care | Payer: BLUE CROSS/BLUE SHIELD | Attending: Student in an Organized Health Care Education/Training Program | Admitting: Student in an Organized Health Care Education/Training Program

## 2017-02-14 ENCOUNTER — Encounter: Payer: Self-pay | Admitting: *Deleted

## 2017-02-14 DIAGNOSIS — R509 Fever, unspecified: Secondary | ICD-10-CM | POA: Insufficient documentation

## 2017-02-14 DIAGNOSIS — R69 Illness, unspecified: Secondary | ICD-10-CM

## 2017-02-14 DIAGNOSIS — Z791 Long term (current) use of non-steroidal anti-inflammatories (NSAID): Secondary | ICD-10-CM | POA: Insufficient documentation

## 2017-02-14 DIAGNOSIS — R112 Nausea with vomiting, unspecified: Secondary | ICD-10-CM | POA: Insufficient documentation

## 2017-02-14 DIAGNOSIS — R0981 Nasal congestion: Secondary | ICD-10-CM | POA: Insufficient documentation

## 2017-02-14 DIAGNOSIS — M791 Myalgia: Secondary | ICD-10-CM | POA: Insufficient documentation

## 2017-02-14 DIAGNOSIS — J111 Influenza due to unidentified influenza virus with other respiratory manifestations: Secondary | ICD-10-CM

## 2017-02-14 DIAGNOSIS — R05 Cough: Secondary | ICD-10-CM | POA: Insufficient documentation

## 2017-02-14 DIAGNOSIS — J029 Acute pharyngitis, unspecified: Secondary | ICD-10-CM | POA: Insufficient documentation

## 2017-02-14 MED ORDER — IBUPROFEN 800 MG PO TABS: 800.0000 mg | ORAL_TABLET | Freq: Three times a day (TID) | ORAL | 0 refills | Status: DC | PRN

## 2017-02-14 MED ORDER — FLUTICASONE PROPIONATE 50 MCG/ACT NA SUSP: 1.0000 | Freq: Two times a day (BID) | NASAL | 2 refills | Status: DC

## 2017-02-14 MED ORDER — HYDROCOD POLST-CPM POLST ER 10-8 MG/5ML PO SUER: 5.0000 mL | Freq: Every evening | ORAL | 0 refills | Status: DC | PRN
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
## 2017-02-14 NOTE — ED Triage Notes (Signed)
States green productive cough for 4 days with nasal congestion with headache

## 2017-02-14 NOTE — ED Notes (Signed)
Pt reports cough, runny nose, chills, fever for 4 days  Taking otc meds without relief.  Pt alert.  Family with pt.

## 2017-03-01 ENCOUNTER — Emergency Department
Admission: EM | Admit: 2017-03-01 | Discharge: 2017-03-01 | Disposition: A | Discharge status: Home / Self Care | Payer: Self-pay | Attending: Emergency Medicine | Admitting: Emergency Medicine

## 2017-03-01 DIAGNOSIS — N946 Dysmenorrhea, unspecified: Secondary | ICD-10-CM | POA: Insufficient documentation

## 2017-03-01 MED ORDER — KETOROLAC TROMETHAMINE 30 MG/ML IJ SOLN
30.0000 mg | Freq: Once | INTRAMUSCULAR | Status: DC
  Filled 2017-03-01: qty 1

## 2017-03-01 MED ORDER — CYCLOBENZAPRINE HCL 10 MG PO TABS
10.0000 mg | ORAL_TABLET | Freq: Once | ORAL | Status: AC
  Administered 2017-03-01: 10 mg via ORAL
  Filled 2017-03-01: qty 1

## 2017-03-01 MED ORDER — CYCLOBENZAPRINE HCL 5 MG PO TABS: 5.0000 mg | ORAL_TABLET | Freq: Three times a day (TID) | ORAL | 0 refills | Status: DC | PRN

## 2017-03-01 NOTE — ED Notes (Signed)
See triage note, per pt she has "monthly severe menstrual cramps and gets a shot that helps." Pt is unsure of what the pain shot is called at this time. Pt states she does see an OBGYN and had an ultrasound "last year sometime to see if I had endometriosis but the OBGYN said I did not and is not sure why I have bad periods." Pt states she also tried birth control without relief. Pt reports lower abd, cramping pain.
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
## 2017-03-01 NOTE — ED Triage Notes (Signed)
Pt states is here for pain medicine for menstral cramps. Pt states she has severe cramps with menstration. Pt states 'normally they give me a shot and it helps". Pt denies other symptoms, appears in no acute distress.

## 2017-03-01 NOTE — ED Notes (Signed)
Per pt, she does not want the shot. Pt states "it burns and I will just take midol when I get home which usually helps and my period will be over tomorrow." This nurse asked pt if she is sure she does not want the shot because "when you first got here you told me you get a shot and it helps you." Pt verbalized again "I do not want the shot and I am ready to go."

## 2017-03-01 NOTE — Discharge Instructions (Signed)
Please continue with Midol and Flexeril as needed. Continue with heating pad. Follow-up with GYN physician in 2-3 days if no improvement. Return to the ER for any severe heavy bleeding, worsening symptoms urgent changes in her health.

## 2017-03-09 ENCOUNTER — Encounter: Payer: Self-pay | Admitting: Family Medicine

## 2017-03-09 ENCOUNTER — Ambulatory Visit (INDEPENDENT_AMBULATORY_CARE_PROVIDER_SITE_OTHER): Payer: BLUE CROSS/BLUE SHIELD | Admitting: Family Medicine

## 2017-03-09 VITALS — BP 130/70 | HR 93 | Temp 98.4°F | Resp 16 | Wt 154.2 lb

## 2017-03-09 DIAGNOSIS — J029 Acute pharyngitis, unspecified: Secondary | ICD-10-CM | POA: Diagnosis not present

## 2017-03-09 LAB — POCT RAPID STREP A (OFFICE): RAPID STREP A SCREEN: NEGATIVE

## 2017-03-09 NOTE — Progress Notes (Signed)
Subjective:     Patient ID: Barbara Chapman, female   DOB: 05/14/1996, 21 y.o.   MRN: [id number]  HPI  Chief Complaint  Patient presents with  . Sinus Problem    Patient comes in office today with concerns of sinus pain and pressure for the past 48hrs. Patient reports that sinus pain is covering entire head and states that she has bilateral ear pain, but pain is mostly on right side. Associated with sinus symptoms patient complains of fatigue and sore throat mainly on right side, she has not taken any otc medication.   Reports onset of sinus congestion but no cough, fever, or chills. Accompanied by her girl friend, Barbara Chapman.   Review of Systems     Objective:   Physical Exam  Constitutional: She appears well-developed and well-nourished. No distress.  Ears: T.M's intact without inflammation Throat: mild tonsillar enlargement without exudate Neck: right anterior cervical tenderness Lungs: clear     Assessment:    1. Pharyngitis, unspecified etiology: early URI  - POCT rapid strep A    Plan:    Discussed use of otc medication. Work excuse provided for 3/22 to 03/12/17

## 2017-03-09 NOTE — Patient Instructions (Signed)
Discussed use of Mucinex D for congestion and Delsym for cough. 

## 2017-03-28 ENCOUNTER — Encounter: Payer: Self-pay | Admitting: Emergency Medicine

## 2017-03-28 ENCOUNTER — Emergency Department: Payer: BLUE CROSS/BLUE SHIELD

## 2017-03-28 ENCOUNTER — Emergency Department
Admission: EM | Admit: 2017-03-28 | Discharge: 2017-03-28 | Disposition: A | Discharge status: Home / Self Care | Payer: BLUE CROSS/BLUE SHIELD | Attending: Emergency Medicine | Admitting: Emergency Medicine

## 2017-03-28 DIAGNOSIS — R079 Chest pain, unspecified: Secondary | ICD-10-CM

## 2017-03-28 DIAGNOSIS — Z79899 Other long term (current) drug therapy: Secondary | ICD-10-CM | POA: Diagnosis not present

## 2017-03-28 DIAGNOSIS — K219 Gastro-esophageal reflux disease without esophagitis: Secondary | ICD-10-CM | POA: Insufficient documentation

## 2017-03-28 LAB — CBC
HCT: 33.4 % — ABNORMAL LOW (ref 35.0–47.0)
Hemoglobin: 10.6 g/dL — ABNORMAL LOW (ref 12.0–16.0)
MCH: 22.3 pg — AB (ref 26.0–34.0)
MCHC: 31.6 g/dL — AB (ref 32.0–36.0)
MCV: 70.5 fL — ABNORMAL LOW (ref 80.0–100.0)
Platelets: 462 10*3/uL — ABNORMAL HIGH (ref 150–440)
RBC: 4.74 MIL/uL (ref 3.80–5.20)
RDW: 20.2 % — AB (ref 11.5–14.5)
WBC: 7.5 10*3/uL (ref 3.6–11.0)

## 2017-03-28 LAB — BASIC METABOLIC PANEL
Anion gap: 5 (ref 5–15)
BUN: 10 mg/dL (ref 6–20)
CALCIUM: 8.9 mg/dL (ref 8.9–10.3)
CO2: 26 mmol/L (ref 22–32)
Chloride: 106 mmol/L (ref 101–111)
Creatinine, Ser: 0.9 mg/dL (ref 0.44–1.00)
GFR calc Af Amer: >60 mL/min (ref >60)
GLUCOSE: 83 mg/dL (ref 65–99)
Potassium: 3.4 mmol/L — ABNORMAL LOW (ref 3.5–5.1)
Sodium: 137 mmol/L (ref 135–145)

## 2017-03-28 LAB — TROPONIN I

## 2017-03-28 MED ORDER — SUCRALFATE 1 G PO TABS: 1.0000 g | ORAL_TABLET | Freq: Four times a day (QID) | ORAL | 0 refills | Status: DC

## 2017-03-28 MED ORDER — FAMOTIDINE 40 MG PO TABS: 40.0000 mg | ORAL_TABLET | Freq: Every evening | ORAL | 1 refills | Status: DC

## 2017-03-28 MED ORDER — GI COCKTAIL ~~LOC~~
30.0000 mL | Freq: Once | ORAL | Status: AC
  Administered 2017-03-28: 30 mL via ORAL
  Filled 2017-03-28: qty 30

## 2017-03-28 NOTE — Discharge Instructions (Signed)
Please seek medical attention for any high fevers, chest pain, shortness of breath, change in behavior, persistent vomiting, bloody stool or any other new or concerning symptoms.  

## 2017-03-28 NOTE — ED Notes (Signed)
POC Urine Preg negative 

## 2017-03-28 NOTE — ED Triage Notes (Signed)
Pt presents to ED c/o L-sided chest pain and epigastric pain starting 1hr prior to arrival. Pt states pain started while eating [employer]. Denies SOB, dizziness.
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
## 2018-01-15 ENCOUNTER — Encounter: Payer: Self-pay | Admitting: Obstetrics and Gynecology

## 2018-01-15 ENCOUNTER — Ambulatory Visit (INDEPENDENT_AMBULATORY_CARE_PROVIDER_SITE_OTHER): Payer: BLUE CROSS/BLUE SHIELD | Admitting: Obstetrics and Gynecology

## 2018-01-15 VITALS — BP 114/78 | Ht 65.0 in | Wt 136.0 lb

## 2018-01-15 DIAGNOSIS — Z30013 Encounter for initial prescription of injectable contraceptive: Secondary | ICD-10-CM

## 2018-01-15 DIAGNOSIS — N946 Dysmenorrhea, unspecified: Secondary | ICD-10-CM

## 2018-01-15 MED ORDER — MEDROXYPROGESTERONE ACETATE 150 MG/ML IM SUSY: 150.0000 mg | PREFILLED_SYRINGE | Freq: Once | INTRAMUSCULAR | 0 refills | Status: DC

## 2018-01-15 NOTE — Progress Notes (Signed)
Chief Complaint  Patient presents with  . Menstrual Problem  . Pelvic Pain    HPI:      Barbara Chapman is a 22 y.o. G0P0000 who LMP was Patient's last menstrual period was 01/14/2018., presents today for severe dysmen. Pt has long hx of cramps and has tried OCPs and xulane. Pt tried cont dosing of xulane but still had cramping on active wks. Pt's mom with hx of endometriosis. I saw pt for same sx 11/17 and we discussed depo if sx persisted on OCPs/xulane. Had neg GYN u/s at that time. Never been on depo, despite hx in chart. Pt has been to ED twice in 2018 for toradol inj due to dysmen, but she never has pelvic pain unless it's during menses. Pt's menses are monthly, lasting 5 days. Pt is not sex active and hasn't been recently.  Menses started yesterday. Taking midol to help with sx.  Past due for annual.    Past Medical History:  Diagnosis Date  . Dysmenorrhea     Past Surgical History:  Procedure Laterality Date  . NO PAST SURGERIES      Family History  Problem Relation Age of Onset  . Endometriosis Mother   . Hypertension Mother   . Hypertension Father   . Heart disease Maternal Grandmother   . Hypertension Maternal Grandmother   . Hypertension Maternal Grandfather   . Hypertension Paternal Grandmother   . Hypertension Paternal Grandfather   . Breast cancer Other   . Colon cancer Other   . Colon cancer Other     Social History   Socioeconomic History  . Marital status: Single    Spouse name: Not on file  . Number of children: Not on file  . Years of education: Not on file  . Highest education level: Not on file  Social Needs  . Financial resource strain: Not on file  . Food insecurity - worry: Not on file  . Food insecurity - inability: Not on file  . Transportation needs - medical: Not on file  . Transportation needs - non-medical: Not on file  Occupational History  . Not on file  Tobacco Use  . Smoking status: Never Smoker  . Smokeless tobacco:  Never Used  Substance and Sexual Activity  . Alcohol use: No  . Drug use: Yes    Types: Marijuana  . Sexual activity: No  Other Topics Concern  . Not on file  Social History Narrative  . Not on file     Current Outpatient Medications:  .  cyclobenzaprine (FLEXERIL) 5 MG tablet, Take 1 tablet (5 mg total) by mouth 3 (three) times daily as needed for muscle spasms. (Patient not taking: Reported on 03/09/2017), Disp: 20 tablet, Rfl: 0 .  famotidine (PEPCID) 40 MG tablet, Take 1 tablet (40 mg total) by mouth every evening. (Patient not taking: Reported on 01/15/2018), Disp: 30 tablet, Rfl: 1 .  fluticasone (FLONASE) 50 MCG/ACT nasal spray, Place 1 spray into both nostrils 2 (two) times daily. (Patient not taking: Reported on 03/09/2017), Disp: 16 g, Rfl: 2 .  ibuprofen (ADVIL,MOTRIN) 800 MG tablet, Take 1 tablet (800 mg total) by mouth every 8 (eight) hours as needed. (Patient not taking: Reported on 01/15/2018), Disp: 30 tablet, Rfl: 0 .  MedroxyPROGESTERone Acetate 150 MG/ML SUSY, Inject 1 mL (150 mg total) into the muscle once for 1 dose., Disp: 1 Syringe, Rfl: 0 .  ondansetron (ZOFRAN ODT) 4 MG disintegrating tablet, Take 1 tablet (4 mg  total) by mouth every 6 (six) hours as needed for nausea or vomiting. (Patient not taking: Reported on 03/09/2017), Disp: 15 tablet, Rfl: 0 .  sucralfate (CARAFATE) 1 g tablet, Take 1 tablet (1 g total) by mouth 4 (four) times daily. (Patient not taking: Reported on 01/15/2018), Disp: 60 tablet, Rfl: 0 .  traMADol (ULTRAM) 50 MG tablet, Take 1 tablet (50 mg total) by mouth every 6 (six) hours as needed. (Patient not taking: Reported on 03/09/2017), Disp: 12 tablet, Rfl: 0   ROS:  Review of Systems  Constitutional: Negative for fever.  Gastrointestinal: Negative for blood in stool, constipation, diarrhea, nausea and vomiting.  Genitourinary: Positive for pelvic pain. Negative for dyspareunia, dysuria, flank pain, frequency, hematuria, urgency, vaginal bleeding,  vaginal discharge and vaginal pain.  Musculoskeletal: Negative for back pain.  Skin: Negative for rash.     OBJECTIVE:   Vitals:  BP 114/78   Ht 5\' 5"  (1.651 m)   Wt 136 lb (61.7 kg)   LMP 01/14/2018   BMI 22.63 kg/m   Physical Exam  Constitutional: She is oriented to person, place, and time and well-developed, well-nourished, and in no distress.  Neurological: She is alert and oriented to person, place, and time.  Psychiatric: Memory, affect and judgment normal.  Vitals reviewed.  Assessment/Plan: Dysmenorrhea - Failed OCPs/xulane for sx control. Try depo. If sx still persist, suggest dx lap for possible endometriosis. Rx depo. RTO this menses for inj. Not sex active  Encounter for initial prescription of injectable contraceptive    Meds ordered this encounter  Medications  . MedroxyPROGESTERone Acetate 150 MG/ML SUSY    Sig: Inject 1 mL (150 mg total) into the muscle once for 1 dose.    Dispense:  1 Syringe    Refill:  0      Return in about 1 month (around 02/15/2018) for annual/dysmen f/u.  Barbara Cardon B. Claudette Wermuth, PA-C 01/15/2018 10:48 AM

## 2018-01-15 NOTE — Patient Instructions (Signed)
I value your feedback and entrusting us with your care. If you get a [facility] patient survey, I would appreciate you taking the time to let us know about your experience today. Thank you! 

## 2018-01-16 ENCOUNTER — Other Ambulatory Visit: Payer: Self-pay | Admitting: Obstetrics and Gynecology

## 2018-01-16 ENCOUNTER — Telehealth: Payer: Self-pay

## 2018-01-16 MED ORDER — TRAMADOL HCL 50 MG PO TABS: 50.0000 mg | ORAL_TABLET | Freq: Four times a day (QID) | ORAL | 0 refills | Status: DC | PRN

## 2018-01-16 NOTE — Telephone Encounter (Signed)
Pt's mother Elonda HuskyCassandra called yesterday at 4:56 pm stating that pt had been seen for severe menstrual cramps and requested rx for pain medication be sent to the [facility] on [address]. Pt is taking tylenol and midol with no relief. Please advise. Mom's cb# [phone]. Thank you.

## 2018-01-16 NOTE — Progress Notes (Signed)
Rx RF for severe dysmen. Starting depo for sx. Been to ED for toradol twice in 2018.

## 2018-01-16 NOTE — Telephone Encounter (Signed)
Pls fax Rx ultram to pharm and notify pt's mom. Thx.

## 2018-01-16 NOTE — Telephone Encounter (Signed)
Pt's mother aware. rx sent.

## 2018-02-19 ENCOUNTER — Ambulatory Visit: Payer: BLUE CROSS/BLUE SHIELD | Admitting: Obstetrics and Gynecology

## 2019-02-11 ENCOUNTER — Other Ambulatory Visit: Payer: Self-pay

## 2019-02-11 ENCOUNTER — Encounter: Payer: Self-pay | Admitting: Emergency Medicine

## 2019-02-11 ENCOUNTER — Emergency Department
Admission: EM | Admit: 2019-02-11 | Discharge: 2019-02-11 | Disposition: A | Discharge status: Home / Self Care | Payer: BLUE CROSS/BLUE SHIELD | Attending: Emergency Medicine | Admitting: Emergency Medicine

## 2019-02-11 DIAGNOSIS — N946 Dysmenorrhea, unspecified: Secondary | ICD-10-CM | POA: Insufficient documentation

## 2019-02-11 DIAGNOSIS — N920 Excessive and frequent menstruation with regular cycle: Secondary | ICD-10-CM | POA: Diagnosis present

## 2019-02-11 LAB — POCT PREGNANCY, URINE: PREG TEST UR: NEGATIVE

## 2019-02-11 MED ORDER — ONDANSETRON 4 MG PO TBDP
4.0000 mg | ORAL_TABLET | Freq: Once | ORAL | Status: AC
  Administered 2019-02-11: 4 mg via ORAL
  Filled 2019-02-11: qty 1

## 2019-02-11 MED ORDER — KETOROLAC TROMETHAMINE 10 MG PO TABS: 10.0000 mg | ORAL_TABLET | Freq: Four times a day (QID) | ORAL | 0 refills | Status: DC | PRN

## 2019-02-11 MED ORDER — ONDANSETRON HCL 4 MG PO TABS: 4.0000 mg | ORAL_TABLET | Freq: Three times a day (TID) | ORAL | 0 refills | Status: DC | PRN

## 2019-02-11 MED ORDER — KETOROLAC TROMETHAMINE 30 MG/ML IJ SOLN
30.0000 mg | Freq: Once | INTRAMUSCULAR | Status: AC
  Administered 2019-02-11: 30 mg via INTRAMUSCULAR

## 2019-02-11 MED ORDER — KETOROLAC TROMETHAMINE 30 MG/ML IJ SOLN
30.0000 mg | Freq: Once | INTRAMUSCULAR | Status: DC
  Filled 2019-02-11: qty 1
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
## 2019-02-11 NOTE — ED Triage Notes (Signed)
Pt presents to ED via POV with c/o menstrual cramps at this time. Pt also c/o N/V. Pt actively vomiting in triage at this time. Pt appears uncomfortable at this time. VSS.

## 2019-02-11 NOTE — ED Notes (Signed)
Pt's care discussed with Dr. Derrill Kay.

## 2019-02-11 NOTE — ED Notes (Signed)
See triage note  Presents with "bad" menstrual cramps  States she started her period this am  Usually has cramping but today the pain was different and she felt weak  No fever

## 2019-04-04 ENCOUNTER — Telehealth: Payer: Self-pay | Admitting: Family Medicine

## 2019-04-04 NOTE — Telephone Encounter (Signed)
Left message to call back  

## 2019-04-04 NOTE — Telephone Encounter (Signed)
Pt has been having diarrhea and throwing up this morning.  She does not think she has a fever. She does not have any other symptoms  CB#  [phone]  Thanks Barth Kirks

## 2019-04-08 NOTE — Telephone Encounter (Signed)
Tried calling patient. Left message to call back. 

## 2019-04-09 NOTE — Telephone Encounter (Signed)
Unable to contact the patient. Will save message to the chart.  

## 2019-09-13 ENCOUNTER — Telehealth: Payer: Self-pay

## 2019-09-13 ENCOUNTER — Emergency Department
Admission: EM | Admit: 2019-09-13 | Discharge: 2019-09-13 | Disposition: A | Discharge status: Home / Self Care | Payer: BLUE CROSS/BLUE SHIELD | Attending: Emergency Medicine | Admitting: Emergency Medicine

## 2019-09-13 ENCOUNTER — Encounter: Payer: Self-pay | Admitting: Intensive Care

## 2019-09-13 ENCOUNTER — Other Ambulatory Visit: Payer: Self-pay

## 2019-09-13 DIAGNOSIS — N946 Dysmenorrhea, unspecified: Secondary | ICD-10-CM | POA: Insufficient documentation

## 2019-09-13 DIAGNOSIS — D500 Iron deficiency anemia secondary to blood loss (chronic): Secondary | ICD-10-CM | POA: Diagnosis not present

## 2019-09-13 DIAGNOSIS — R102 Pelvic and perineal pain: Secondary | ICD-10-CM | POA: Diagnosis present

## 2019-09-13 LAB — COMPREHENSIVE METABOLIC PANEL
ALT: 32 U/L (ref 0–44)
AST: 32 U/L (ref 15–41)
Albumin: 4.3 g/dL (ref 3.5–5.0)
Alkaline Phosphatase: 84 U/L (ref 38–126)
Anion gap: 10 (ref 5–15)
BUN: 10 mg/dL (ref 6–20)
CO2: 22 mmol/L (ref 22–32)
Calcium: 9 mg/dL (ref 8.9–10.3)
Chloride: 107 mmol/L (ref 98–111)
Creatinine, Ser: 0.74 mg/dL (ref 0.44–1.00)
GFR calc Af Amer: >60 mL/min (ref >60)
GFR calc non Af Amer: >60 mL/min (ref >60)
Glucose, Bld: 104 mg/dL — ABNORMAL HIGH (ref 70–99)
Potassium: 3.7 mmol/L (ref 3.5–5.1)
Sodium: 139 mmol/L (ref 135–145)
Total Bilirubin: 0.9 mg/dL (ref 0.3–1.2)
Total Protein: 8.1 g/dL (ref 6.5–8.1)

## 2019-09-13 LAB — CBC
HCT: 34.9 % — ABNORMAL LOW (ref 36.0–46.0)
Hemoglobin: 10.4 g/dL — ABNORMAL LOW (ref 12.0–15.0)
MCH: 20.5 pg — ABNORMAL LOW (ref 26.0–34.0)
MCHC: 29.8 g/dL — ABNORMAL LOW (ref 30.0–36.0)
MCV: 68.7 fL — ABNORMAL LOW (ref 80.0–100.0)
Platelets: 445 10*3/uL — ABNORMAL HIGH (ref 150–400)
RBC: 5.08 MIL/uL (ref 3.87–5.11)
RDW: 19.7 % — ABNORMAL HIGH (ref 11.5–15.5)
WBC: 11.1 10*3/uL — ABNORMAL HIGH (ref 4.0–10.5)
nRBC: 0 % (ref 0.0–0.2)

## 2019-09-13 LAB — LIPASE, BLOOD: Lipase: 25 U/L (ref 11–51)

## 2019-09-13 MED ORDER — FERROUS SULFATE 325 (65 FE) MG PO TBEC: 325.0000 mg | DELAYED_RELEASE_TABLET | Freq: Every day | ORAL | 1 refills | Status: DC

## 2019-09-13 MED ORDER — HYDROCODONE-ACETAMINOPHEN 5-325 MG PO TABS: 1.0000 | ORAL_TABLET | Freq: Four times a day (QID) | ORAL | 0 refills | Status: AC | PRN

## 2019-09-13 MED ORDER — ONDANSETRON 4 MG PO TBDP
4.0000 mg | ORAL_TABLET | Freq: Once | ORAL | Status: AC | PRN
  Administered 2019-09-13: 4 mg via ORAL
  Filled 2019-09-13: qty 1

## 2019-09-13 MED ORDER — ONDANSETRON 4 MG PO TBDP
ORAL_TABLET | ORAL | Status: AC
  Filled 2019-09-13: qty 1

## 2019-09-13 MED ORDER — NAPROXEN 500 MG PO TABS
500.0000 mg | ORAL_TABLET | Freq: Once | ORAL | Status: AC
  Administered 2019-09-13: 500 mg via ORAL
  Filled 2019-09-13: qty 1

## 2019-09-13 MED ORDER — HYDROCODONE-ACETAMINOPHEN 5-325 MG PO TABS
1.0000 | ORAL_TABLET | Freq: Once | ORAL | Status: AC
  Administered 2019-09-13: 1 via ORAL
  Filled 2019-09-13: qty 1

## 2019-09-13 MED ORDER — SODIUM CHLORIDE 0.9% FLUSH: 3.0000 mL | Freq: Once | INTRAVENOUS | Status: DC

## 2019-09-13 MED ORDER — NAPROXEN 375 MG PO TABS: 375.0000 mg | ORAL_TABLET | Freq: Two times a day (BID) | ORAL | 0 refills | Status: AC | PRN

## 2019-09-13 MED ORDER — ONDANSETRON 4 MG PO TBDP
4.0000 mg | ORAL_TABLET | Freq: Once | ORAL | Status: AC
  Administered 2019-09-13: 4 mg via ORAL
  Filled 2019-09-13: qty 1

## 2019-09-13 MED ORDER — ONDANSETRON 4 MG PO TBDP: 4.0000 mg | ORAL_TABLET | Freq: Three times a day (TID) | ORAL | 0 refills | Status: DC | PRN

## 2019-09-13 NOTE — Telephone Encounter (Signed)
Pt and her mother requesting that pt be put back on medication she was given in the past for her bad menstrual cramping. Pt cannot remember the name of the medication but she would appreciate if that could happen. Pt recently got out of hospital for the cramping. Pt has not been seen with [place] in a while.
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
## 2019-09-13 NOTE — Discharge Instructions (Addendum)
Take the Naproxen twice a day for 7 days. Do NOT take Midol or Ibuprofen with this - this is a stronger version of this.  Take the Hydrocodone for severe pain.  Use heating pads on your abdomen as needed. Avoid direct contact to prevent burns.  Call your doctor to discuss IUD (Mirena) placement to help with your pain

## 2019-09-13 NOTE — ED Triage Notes (Signed)
Patient is here for abd pain with nausea/emesis due to menstrual cramping that started this AM.

## 2019-09-14 NOTE — Telephone Encounter (Signed)
Pt was last on depo for periods. Hasn't been seen in 1 1/2 yrs. Needs annual appt.

## 2019-09-16 NOTE — Telephone Encounter (Signed)
Please call pt to schedule annual.

## 2019-09-16 NOTE — Telephone Encounter (Signed)
Called and left voice mail for patient to call back to be schedule °

## 2021-06-18 ENCOUNTER — Ambulatory Visit (INDEPENDENT_AMBULATORY_CARE_PROVIDER_SITE_OTHER): Payer: Self-pay | Admitting: Obstetrics and Gynecology

## 2021-06-18 ENCOUNTER — Other Ambulatory Visit: Payer: Self-pay

## 2021-06-18 ENCOUNTER — Encounter: Payer: Self-pay | Admitting: Obstetrics and Gynecology

## 2021-06-18 ENCOUNTER — Other Ambulatory Visit (HOSPITAL_COMMUNITY)
Admission: RE | Admit: 2021-06-18 | Discharge: 2021-06-18 | Disposition: A | Discharge status: Home / Self Care | Payer: BLUE CROSS/BLUE SHIELD | Source: Ambulatory Visit | Attending: Obstetrics and Gynecology | Admitting: Obstetrics and Gynecology

## 2021-06-18 VITALS — BP 120/80 | Ht 65.0 in | Wt 146.0 lb

## 2021-06-18 DIAGNOSIS — D500 Iron deficiency anemia secondary to blood loss (chronic): Secondary | ICD-10-CM

## 2021-06-18 DIAGNOSIS — Z124 Encounter for screening for malignant neoplasm of cervix: Secondary | ICD-10-CM

## 2021-06-18 DIAGNOSIS — N946 Dysmenorrhea, unspecified: Secondary | ICD-10-CM

## 2021-06-18 DIAGNOSIS — N939 Abnormal uterine and vaginal bleeding, unspecified: Secondary | ICD-10-CM

## 2021-06-18 NOTE — Progress Notes (Signed)
Barbara Gurney, PA   Chief Complaint  Patient presents with   Gynecologic Exam   Menstrual Problem    Pt has had about 4 cycles in the last month and a half    HPI:      [name] is a 25 y.o. [id number] whose LMP was Patient's last menstrual period was 06/13/2021 (exact date)., presents today for AUB for past 6-8 wks. Menses are usually monthly, lasting 5 days, no BTB, mod to severe dysmen, improved with lots of NSAID use/heating pad. Has nausea and diarrhea with menses. Has been seen in past for dysmen and treated with OCPs and xulane without relief; depo recommended but pt never tried. Neg GYN u/s in 2017. Has been to ED for pain meds due to dysmen. FH endometriosis in her mom.  Pt's menses now every 2 wks for past 6-8 wks, sometimes lasting 5 days, and sometimes with daily light spotting till heavy bleeding restarts. Dysmen is same as usual periods. Bleeding stopped 2 days ago, no bleeding today.  Hx of IDA in past, not taking Fe supp. No recent sickness, wt change, increased stress, med changes.  Pt has never been sex active, no pap done.    Past Medical History:  Diagnosis Date   Dysmenorrhea     Past Surgical History:  Procedure Laterality Date   NO PAST SURGERIES      Family History  Problem Relation Age of Onset   Endometriosis Mother    Hypertension Mother    Hypertension Father    Heart disease Maternal Grandmother    Hypertension Maternal Grandmother    Hypertension Maternal Grandfather    Hypertension Paternal Grandmother    Hypertension Paternal Grandfather    Breast cancer Other        late years   Colon cancer Other    Colon cancer Other     Social History   Socioeconomic History   Marital status: Single    Spouse name: Not on file   Number of children: Not on file   Years of education: Not on file   Highest education level: Not on file  Occupational History   Not on file  Tobacco Use   Smoking status: Never   Smokeless tobacco:  Never  Vaping Use   Vaping Use: Never used  Substance and Sexual Activity   Alcohol use: No   Drug use: Yes    Types: Marijuana   Sexual activity: Never    Birth control/protection: None  Other Topics Concern   Not on file  Social History Narrative   Not on file   Social Determinants of Health   Financial Resource Strain: Not on file  Food Insecurity: Not on file  Transportation Needs: Not on file  Physical Activity: Not on file  Stress: Not on file  Social Connections: Not on file  Intimate Partner Violence: Not on file    Outpatient Medications Prior to Visit  Medication Sig Dispense Refill   ferrous sulfate 325 (65 FE) MG EC tablet Take 1 tablet (325 mg total) by mouth daily with breakfast. 30 tablet 1   ketorolac (TORADOL) 10 MG tablet Take 1 tablet (10 mg total) by mouth every 6 (six) hours as needed. 20 tablet 0   MedroxyPROGESTERone Acetate 150 MG/ML SUSY Inject 1 mL (150 mg total) into the muscle once for 1 dose. 1 Syringe 0   ondansetron (ZOFRAN ODT) 4 MG disintegrating tablet Take 1 tablet (4 mg total) by mouth every  8 (eight) hours as needed for nausea or vomiting. 20 tablet 0   ondansetron (ZOFRAN) 4 MG tablet Take 1 tablet (4 mg total) by mouth every 8 (eight) hours as needed for nausea or vomiting. 20 tablet 0   traMADol (ULTRAM) 50 MG tablet Take 1 tablet (50 mg total) by mouth every 6 (six) hours as needed. 12 tablet 0   No facility-administered medications prior to visit.    ROS:  Review of Systems  Constitutional:  Negative for fever.  Gastrointestinal:  Positive for diarrhea and nausea. Negative for blood in stool, constipation and vomiting.  Genitourinary:  Positive for menstrual problem and pelvic pain. Negative for dyspareunia, dysuria, flank pain, frequency, hematuria, urgency, vaginal bleeding, vaginal discharge and vaginal pain.  Musculoskeletal:  Negative for back pain.  Skin:  Negative for rash.  BREAST: No symptoms   OBJECTIVE:   Vitals:   BP 120/80   Ht 5\' 5"  (1.651 m)   Wt 146 lb (66.2 kg)   LMP 06/13/2021 (Exact Date)   BMI 24.30 kg/m   Physical Exam Vitals reviewed.  Constitutional:      Appearance: She is well-developed.  Pulmonary:     Effort: Pulmonary effort is normal.  Genitourinary:    General: Normal vulva.     Pubic Area: No rash.      Labia:        Right: No rash, tenderness or lesion.        Left: No rash, tenderness or lesion.      Vagina: Normal. No vaginal discharge, erythema, tenderness or bleeding.     Cervix: Normal.     Uterus: Normal. Not enlarged and not tender.      Adnexa: Right adnexa normal and left adnexa normal.       Right: No mass or tenderness.         Left: No mass or tenderness.    Musculoskeletal:        General: Normal range of motion.     Cervical back: Normal range of motion.  Skin:    General: Skin is warm and dry.  Neurological:     General: No focal deficit present.     Mental Status: She is alert and oriented to person, place, and time.  Psychiatric:        Mood and Affect: Mood normal.        Behavior: Behavior normal.        Thought Content: Thought content normal.        Judgment: Judgment normal.    Assessment/Plan: Abnormal uterine bleeding (AUB) - Plan: CBC with Differential/Platelet, TSH; for past 6-8 wks, no bleeding today. Check labs, pap. F/u if bleeding restarts and is irregular. If so, will try aygestin vs short course OCPs to regulate cycles.   Dysmenorrhea--pt still candidate for depo/BC for sx. F/u prn.   Cervical cancer screening - Plan: Cytology - PAP  Iron deficiency anemia due to chronic blood loss - Plan: CBC with Differential/Platelet; check labs. Will f/u with results. May need Rx Fe supp.     Return if symptoms worsen or fail to improve.  Covey Baller B. Akira Adelsberger, PA-C 06/18/2021 4:38 PM

## 2021-06-22 LAB — CYTOLOGY - PAP
Adequacy: ABSENT
Diagnosis: NEGATIVE

## 2021-07-24 ENCOUNTER — Encounter: Payer: Self-pay | Admitting: Emergency Medicine

## 2021-07-24 ENCOUNTER — Other Ambulatory Visit: Payer: Self-pay

## 2021-07-24 DIAGNOSIS — R519 Headache, unspecified: Secondary | ICD-10-CM | POA: Diagnosis present

## 2021-07-24 DIAGNOSIS — Y92410 Unspecified street and highway as the place of occurrence of the external cause: Secondary | ICD-10-CM | POA: Diagnosis not present

## 2021-07-24 DIAGNOSIS — M25519 Pain in unspecified shoulder: Secondary | ICD-10-CM | POA: Diagnosis not present

## 2021-07-24 DIAGNOSIS — M549 Dorsalgia, unspecified: Secondary | ICD-10-CM | POA: Diagnosis not present

## 2021-07-24 DIAGNOSIS — M542 Cervicalgia: Secondary | ICD-10-CM | POA: Insufficient documentation
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
## 2021-07-25 ENCOUNTER — Emergency Department
Admission: EM | Admit: 2021-07-25 | Discharge: 2021-07-25 | Disposition: A | Discharge status: Home / Self Care | Payer: No Typology Code available for payment source | Attending: Emergency Medicine | Admitting: Emergency Medicine

## 2021-07-25 ENCOUNTER — Other Ambulatory Visit: Payer: Self-pay

## 2021-07-25 ENCOUNTER — Emergency Department: Payer: No Typology Code available for payment source

## 2021-07-25 ENCOUNTER — Encounter: Payer: Self-pay | Admitting: Emergency Medicine

## 2021-07-25 DIAGNOSIS — G44209 Tension-type headache, unspecified, not intractable: Secondary | ICD-10-CM | POA: Insufficient documentation

## 2021-07-25 DIAGNOSIS — M546 Pain in thoracic spine: Secondary | ICD-10-CM | POA: Diagnosis not present

## 2021-07-25 DIAGNOSIS — S161XXA Strain of muscle, fascia and tendon at neck level, initial encounter: Secondary | ICD-10-CM | POA: Diagnosis not present

## 2021-07-25 DIAGNOSIS — Y92410 Unspecified street and highway as the place of occurrence of the external cause: Secondary | ICD-10-CM | POA: Diagnosis not present

## 2021-07-25 DIAGNOSIS — S199XXA Unspecified injury of neck, initial encounter: Secondary | ICD-10-CM | POA: Diagnosis present

## 2021-07-25 MED ORDER — MELOXICAM 15 MG PO TABS: 15.0000 mg | ORAL_TABLET | Freq: Every day | ORAL | 2 refills | Status: AC

## 2021-07-25 MED ORDER — BACLOFEN 10 MG PO TABS: 10.0000 mg | ORAL_TABLET | Freq: Three times a day (TID) | ORAL | 0 refills | Status: AC
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
## 2021-07-25 NOTE — Discharge Instructions (Addendum)
Follow-up with your regular doctor as needed.  Follow-up with orthopedics if not improving in 1 week.  Take medications as prescribed.  Apply ice to all areas that hurt.  Return if worse
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
## 2021-10-12 ENCOUNTER — Emergency Department: Payer: Self-pay

## 2021-10-12 ENCOUNTER — Other Ambulatory Visit: Payer: Self-pay

## 2021-10-12 ENCOUNTER — Emergency Department
Admission: EM | Admit: 2021-10-12 | Discharge: 2021-10-12 | Disposition: A | Discharge status: Home / Self Care | Payer: Self-pay | Attending: Emergency Medicine | Admitting: Emergency Medicine

## 2021-10-12 DIAGNOSIS — Z5321 Procedure and treatment not carried out due to patient leaving prior to being seen by health care provider: Secondary | ICD-10-CM | POA: Insufficient documentation

## 2021-10-12 DIAGNOSIS — R109 Unspecified abdominal pain: Secondary | ICD-10-CM | POA: Insufficient documentation

## 2021-10-12 DIAGNOSIS — R0602 Shortness of breath: Secondary | ICD-10-CM | POA: Insufficient documentation

## 2021-10-12 DIAGNOSIS — K219 Gastro-esophageal reflux disease without esophagitis: Secondary | ICD-10-CM | POA: Insufficient documentation

## 2021-10-12 LAB — COMPREHENSIVE METABOLIC PANEL
ALT: 22 U/L (ref 0–44)
AST: 25 U/L (ref 15–41)
Albumin: 4 g/dL (ref 3.5–5.0)
Alkaline Phosphatase: 82 U/L (ref 38–126)
Anion gap: 6 (ref 5–15)
BUN: 11 mg/dL (ref 6–20)
CO2: 27 mmol/L (ref 22–32)
Calcium: 8.7 mg/dL — ABNORMAL LOW (ref 8.9–10.3)
Chloride: 104 mmol/L (ref 98–111)
Creatinine, Ser: 0.74 mg/dL (ref 0.44–1.00)
GFR, Estimated: >60 mL/min (ref >60)
Glucose, Bld: 108 mg/dL — ABNORMAL HIGH (ref 70–99)
Potassium: 3.3 mmol/L — ABNORMAL LOW (ref 3.5–5.1)
Sodium: 137 mmol/L (ref 135–145)
Total Bilirubin: 0.7 mg/dL (ref 0.3–1.2)
Total Protein: 7.9 g/dL (ref 6.5–8.1)

## 2021-10-12 LAB — URINALYSIS, ROUTINE W REFLEX MICROSCOPIC
Bilirubin Urine: NEGATIVE
Glucose, UA: NEGATIVE mg/dL
Hgb urine dipstick: NEGATIVE
Ketones, ur: 5 mg/dL — AB
Nitrite: NEGATIVE
Protein, ur: 30 mg/dL — AB
Specific Gravity, Urine: 1.028 (ref 1.005–1.030)
pH: 6 (ref 5.0–8.0)

## 2021-10-12 LAB — TROPONIN I (HIGH SENSITIVITY)
Troponin I (High Sensitivity): <2 ng/L (ref <18)
Troponin I (High Sensitivity): <2 ng/L (ref <18)

## 2021-10-12 LAB — CBC WITH DIFFERENTIAL/PLATELET
Abs Immature Granulocytes: 0.04 10*3/uL (ref 0.00–0.07)
Basophils Absolute: 0 10*3/uL (ref 0.0–0.1)
Basophils Relative: 0 %
Eosinophils Absolute: 0 10*3/uL (ref 0.0–0.5)
Eosinophils Relative: 0 %
HCT: 30.7 % — ABNORMAL LOW (ref 36.0–46.0)
Hemoglobin: 9.2 g/dL — ABNORMAL LOW (ref 12.0–15.0)
Immature Granulocytes: 0 %
Lymphocytes Relative: 20 %
Lymphs Abs: 2.5 10*3/uL (ref 0.7–4.0)
MCH: 19.8 pg — ABNORMAL LOW (ref 26.0–34.0)
MCHC: 30 g/dL (ref 30.0–36.0)
MCV: 66 fL — ABNORMAL LOW (ref 80.0–100.0)
Monocytes Absolute: 1.1 10*3/uL — ABNORMAL HIGH (ref 0.1–1.0)
Monocytes Relative: 9 %
Neutro Abs: 9.2 10*3/uL — ABNORMAL HIGH (ref 1.7–7.7)
Neutrophils Relative %: 71 %
Platelets: 392 10*3/uL (ref 150–400)
RBC: 4.65 MIL/uL (ref 3.87–5.11)
RDW: 20.3 % — ABNORMAL HIGH (ref 11.5–15.5)
WBC: 13 10*3/uL — ABNORMAL HIGH (ref 4.0–10.5)
nRBC: 0 % (ref 0.0–0.2)

## 2021-10-12 LAB — POC URINE PREG, ED: Preg Test, Ur: NEGATIVE

## 2021-10-12 LAB — LIPASE, BLOOD: Lipase: 33 U/L (ref 11–51)

## 2021-10-12 MED ORDER — LIDOCAINE VISCOUS HCL 2 % MT SOLN: 15.0000 mL | Freq: Once | OROMUCOSAL | Status: DC

## 2021-10-12 MED ORDER — ALUM & MAG HYDROXIDE-SIMETH 200-200-20 MG/5ML PO SUSP: 15.0000 mL | Freq: Once | ORAL | Status: DC

## 2021-10-12 MED ORDER — ONDANSETRON 4 MG PO TBDP: 4.0000 mg | ORAL_TABLET | Freq: Once | ORAL | Status: DC

## 2021-10-12 NOTE — ED Triage Notes (Signed)
Pt here with abd pain and SOB. Pt has a hx of GERD that she states "can get really bad". Pt c/o of pain at the top of her stomach which is making it hard to breather. Pt in NAD in triage and speaking in complete sentences.

## 2021-10-12 NOTE — ED Provider Notes (Signed)
Emergency Medicine Provider Triage Evaluation Note  Braley MADISSON KULAGA , a 25 y.o. female  was evaluated in triage.  Pt complains of 24 hours of epigastric pain moving up into her chest associated with nausea, vomiting, and shortness of breath.  Review of Systems  Positive: Epigastric pain, chest pain, nausea, vomiting, shortness of breath. Negative: Fever, cough, dysuria.  Physical Exam  BP 134/71 (BP Location: Left Arm)   Pulse 80   Temp 98.4 F (36.9 C) (Oral)   Resp 18   SpO2 99%  Gen:   Awake, no distress  Resp:  Normal effort, lungs clear to auscultation bilaterally. MSK:   Moves extremities without difficulty, 2+ radial pulses bilaterally. Other:  No abdominal tenderness noted.  Medical Decision Making  Medically screening exam initiated at 1:38 PM.  Appropriate orders placed.  Manaal NYSSA SAYEGH was informed that the remainder of the evaluation will be completed by another provider, this initial triage assessment does not replace that evaluation, and the importance of remaining in the ED until their evaluation is complete.  25 year old female presents to the ED complaining 24 hours of pain in her epigastrium moving up into her chest associated with shortness of breath, nausea, and vomiting.  We will check EKG, labs, chest x-ray, and treat symptomatically with GI cocktail and Zofran.   Chesley Noon, MD 10/12/21 1344

## 2021-10-13 ENCOUNTER — Emergency Department
Admission: EM | Admit: 2021-10-13 | Discharge: 2021-10-13 | Disposition: A | Discharge status: Home / Self Care | Payer: Self-pay | Attending: Emergency Medicine | Admitting: Emergency Medicine

## 2021-10-13 ENCOUNTER — Other Ambulatory Visit: Payer: Self-pay

## 2021-10-13 DIAGNOSIS — K2970 Gastritis, unspecified, without bleeding: Secondary | ICD-10-CM | POA: Insufficient documentation

## 2021-10-13 MED ORDER — METOCLOPRAMIDE HCL 10 MG PO TABS: 10.0000 mg | ORAL_TABLET | Freq: Four times a day (QID) | ORAL | 0 refills | Status: DC | PRN

## 2021-10-13 MED ORDER — SUCRALFATE 1 G PO TABS: 1.0000 g | ORAL_TABLET | Freq: Four times a day (QID) | ORAL | 1 refills | Status: DC

## 2021-10-13 MED ORDER — FAMOTIDINE 20 MG PO TABS: 20.0000 mg | ORAL_TABLET | Freq: Two times a day (BID) | ORAL | 0 refills | Status: DC

## 2021-10-13 NOTE — ED Triage Notes (Signed)
Pt returns this morning after LWBS last night, pt c/o upper abd pain, hx of GERD.Marland Kitchen pt denies any pain today.

## 2021-10-13 NOTE — ED Notes (Signed)
Patient assessed by provider prior to discharge.
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
## 2021-10-16 DIAGNOSIS — K219 Gastro-esophageal reflux disease without esophagitis: Secondary | ICD-10-CM | POA: Insufficient documentation

## 2021-12-27 ENCOUNTER — Other Ambulatory Visit: Payer: Self-pay

## 2021-12-27 ENCOUNTER — Emergency Department
Admission: EM | Admit: 2021-12-27 | Discharge: 2021-12-27 | Disposition: A | Discharge status: Home / Self Care | Payer: Self-pay | Attending: Emergency Medicine | Admitting: Emergency Medicine

## 2021-12-27 DIAGNOSIS — N946 Dysmenorrhea, unspecified: Secondary | ICD-10-CM | POA: Insufficient documentation

## 2021-12-27 LAB — URINALYSIS, ROUTINE W REFLEX MICROSCOPIC
Bilirubin Urine: NEGATIVE
Glucose, UA: NEGATIVE mg/dL
Ketones, ur: 5 mg/dL — AB
Nitrite: NEGATIVE
Protein, ur: 100 mg/dL — AB
Specific Gravity, Urine: 1.029 (ref 1.005–1.030)
pH: 6 (ref 5.0–8.0)

## 2021-12-27 LAB — COMPREHENSIVE METABOLIC PANEL
ALT: 23 U/L (ref 0–44)
AST: 31 U/L (ref 15–41)
Albumin: 4.1 g/dL (ref 3.5–5.0)
Alkaline Phosphatase: 91 U/L (ref 38–126)
Anion gap: 13 (ref 5–15)
BUN: 11 mg/dL (ref 6–20)
CO2: 17 mmol/L — ABNORMAL LOW (ref 22–32)
Calcium: 8.7 mg/dL — ABNORMAL LOW (ref 8.9–10.3)
Chloride: 104 mmol/L (ref 98–111)
Creatinine, Ser: 0.75 mg/dL (ref 0.44–1.00)
GFR, Estimated: >60 mL/min (ref >60)
Glucose, Bld: 123 mg/dL — ABNORMAL HIGH (ref 70–99)
Potassium: 3.1 mmol/L — ABNORMAL LOW (ref 3.5–5.1)
Sodium: 134 mmol/L — ABNORMAL LOW (ref 135–145)
Total Bilirubin: 0.7 mg/dL (ref 0.3–1.2)
Total Protein: 7.9 g/dL (ref 6.5–8.1)

## 2021-12-27 LAB — CBC
HCT: 33 % — ABNORMAL LOW (ref 36.0–46.0)
Hemoglobin: 9.6 g/dL — ABNORMAL LOW (ref 12.0–15.0)
MCH: 19.5 pg — ABNORMAL LOW (ref 26.0–34.0)
MCHC: 29.1 g/dL — ABNORMAL LOW (ref 30.0–36.0)
MCV: 66.9 fL — ABNORMAL LOW (ref 80.0–100.0)
Platelets: 479 10*3/uL — ABNORMAL HIGH (ref 150–400)
RBC: 4.93 MIL/uL (ref 3.87–5.11)
RDW: 21.6 % — ABNORMAL HIGH (ref 11.5–15.5)
WBC: 12.1 10*3/uL — ABNORMAL HIGH (ref 4.0–10.5)
nRBC: 0 % (ref 0.0–0.2)

## 2021-12-27 LAB — POC URINE PREG, ED: Preg Test, Ur: NEGATIVE

## 2021-12-27 LAB — LIPASE, BLOOD: Lipase: 32 U/L (ref 11–51)

## 2021-12-27 MED ORDER — KETOROLAC TROMETHAMINE 30 MG/ML IJ SOLN: 30.0000 mg | Freq: Once | INTRAMUSCULAR | Status: DC

## 2021-12-27 MED ORDER — CEPHALEXIN 500 MG PO CAPS: 500.0000 mg | ORAL_CAPSULE | Freq: Two times a day (BID) | ORAL | 0 refills | Status: AC

## 2021-12-27 MED ORDER — POTASSIUM CHLORIDE CRYS ER 20 MEQ PO TBCR: 20.0000 meq | EXTENDED_RELEASE_TABLET | Freq: Two times a day (BID) | ORAL | 0 refills | Status: DC

## 2021-12-27 MED ORDER — OXYCODONE-ACETAMINOPHEN 5-325 MG PO TABS
1.0000 | ORAL_TABLET | Freq: Once | ORAL | Status: AC
  Administered 2021-12-27: 1 via ORAL
  Filled 2021-12-27: qty 1

## 2021-12-27 MED ORDER — ONDANSETRON 4 MG PO TBDP
4.0000 mg | ORAL_TABLET | Freq: Once | ORAL | Status: AC
  Administered 2021-12-27: 4 mg via ORAL
  Filled 2021-12-27: qty 1

## 2021-12-27 MED ORDER — ONDANSETRON 4 MG PO TBDP: 4.0000 mg | ORAL_TABLET | Freq: Three times a day (TID) | ORAL | 0 refills | Status: AC | PRN

## 2021-12-27 MED ORDER — FERROUS SULFATE 325 (65 FE) MG PO TABS: 325.0000 mg | ORAL_TABLET | Freq: Every day | ORAL | 0 refills | Status: DC

## 2021-12-27 MED ORDER — IBUPROFEN 600 MG PO TABS: 600.0000 mg | ORAL_TABLET | Freq: Four times a day (QID) | ORAL | 0 refills | Status: AC | PRN

## 2021-12-27 NOTE — ED Provider Triage Note (Signed)
Emergency Medicine Provider Triage Evaluation Note  Barbara Chapman , a 26 y.o. female  was evaluated in triage.  Pt complains of abdominal pain and menstrual cramping. Symptoms worsen today. No fever. Some nausea..  Review of Systems  Positive: Nausea, abdominal pain Negative: Fever  Physical Exam  There were no vitals taken for this visit. Gen:   Awake, no distress   Resp:  Normal effort  MSK:   Moves extremities without difficulty  Other:    Medical Decision Making  Medically screening exam initiated at 1:29 PM.  Appropriate orders placed.  Barbara Chapman was informed that the remainder of the evaluation will be completed by another provider, this initial triage assessment does not replace that evaluation, and the importance of remaining in the ED until their evaluation is complete.   Chinita Pester, FNP 12/27/21 1331

## 2021-12-27 NOTE — ED Provider Notes (Addendum)
[facility] Provider Note    Event Date/Time   First MD Initiated Contact with Patient 12/27/21 1522     (approximate)   History   Abdominal Pain   HPI  Barbara Chapman is a 26 y.o. female   with dysmenorrhea who comes in with abdominal pain.  Patient reports sudden onset of suprapubic abdominal pain in the setting of her period that started this morning.  Pain was cramping, constant, moderate.  Denies it being on one side of her body.  It was situated suprapubically.  Denies being sexually active.  Has had no sexual activity previously.  No vaginal discharge.  Denies any upper abdominal pain.  Had an episode of nausea and vomiting associated with it.  Patient reports that she commonly gets this when she starts her period.  She is followed by [facility]  I reviewed patient's note from [facility] that patient had a negative ultrasound back in 2017 and they are just treating her for dysmenorrhea.  They have discussed possible lap for possible endometriosis if symptoms are persisting.       Physical Exam   Triage Vital Signs: ED Triage Vitals  Enc Vitals Group     BP 12/27/21 1332 116/77     Pulse Rate 12/27/21 1332 65     Resp 12/27/21 1332 20     Temp 12/27/21 1332 97.8 F (36.6 C)     Temp src --      SpO2 12/27/21 1332 100 %     Weight --      Height --      Head Circumference --      Peak Flow --      Pain Score 12/27/21 1309 10     Pain Loc --      Pain Edu? --      Excl. in [place]? --     Most recent vital signs: Vitals:   12/27/21 1332  BP: 116/77  Pulse: 65  Resp: 20  Temp: 97.8 F (36.6 C)  SpO2: 100%     General: Awake, no distress.  CV:  Good peripheral perfusion.  Resp:  Normal effort.  Lungs clear to auscultation Abd:  No distention.  Abdomen is soft and nontender.  Patient reports a little bit of suprapubic discomfort but without any rebound or guarding. Other:  Pelvic exam was offered but patient declined due  to not being sexually active and no concerns for STDs   ED Results / Procedures / Treatments   Labs (all labs ordered are listed, but only abnormal results are displayed) Labs Reviewed  COMPREHENSIVE METABOLIC PANEL - Abnormal; Notable for the following components:      Result Value   Sodium 134 (*)    Potassium 3.1 (*)    CO2 17 (*)    Glucose, Bld 123 (*)    Calcium 8.7 (*)    All other components within normal limits  CBC - Abnormal; Notable for the following components:   WBC 12.1 (*)    Hemoglobin 9.6 (*)    HCT 33.0 (*)    MCV 66.9 (*)    MCH 19.5 (*)    MCHC 29.1 (*)    RDW 21.6 (*)    Platelets 479 (*)    All other components within normal limits  LIPASE, BLOOD  URINALYSIS, ROUTINE W REFLEX MICROSCOPIC  POC URINE PREG, ED   MEDICATIONS ORDERED IN ED: Medications  oxyCODONE-acetaminophen (PERCOCET/ROXICET) 5-325 MG per tablet 1 tablet (  1 tablet Oral Given 12/27/21 1336)  ondansetron (ZOFRAN-ODT) disintegrating tablet 4 mg (4 mg Oral Given 12/27/21 1336)     IMPRESSION / MDM / ASSESSMENT AND PLAN / ED COURSE  I reviewed the triage vital signs and the nursing notes.    I offered patient additional treatment for her pain but she is stating that her pain is well controlled at this time with medications given in triage.  Patient was given IV oxycodone and a Zofran.  Differential diagnosis includes, but is not limited to, suspect that this is most likely related to her dysmenorrhea.  We will get pregnancy test to ensure patient is not pregnant and this could be an ectopic but patient reports not being sexually active.  No pain on one side of her abdomen to suggest ovarian torsion.  No high risk sex or symptoms to suggest PID.  PT declined pelvic exam. No evidence of peritonitis, appendicitis, cholecystitis based upon my examination.  I have reviewed the note from [place] as stated in HPI.  I reviewed her labs.  White count slightly elevated but patient does not have  any infectious symptoms and similar to prior.  Her hemoglobin is slightly low but similar to priors with a low MCV.  Patient's not been compliant with her iron pills and states that she was never prescribed anything.  I did encourage her to take iron.  Her potassium is also slightly low.  We will provide a few potassium pills for home.  We discussed repeat ultrasound given she does not have one for a few years but patient is adamant this is exactly how she presents for her prior dysmenorrhea and she is had prior ultrasounds without evidence of any cyst.  She is got no pain on one side to suggest an ovarian torsion.  At this time she feels comfortable with discharge home on ibuprofen, Zofran and potassium and iron pills and will return if she develops pain focal to one side.  She is going to call her OB/GYN office to get follow-up.   Patient's urine does have a little bit of leukocytes and WBCs in it.  She denies any dysuria but given suprapubic tenderness we will start a short course of Keflex and send for urine culture just in case this could be a UTI.     FINAL CLINICAL IMPRESSION(S) / ED DIAGNOSES   Final diagnoses:  Dysmenorrhea     Rx / DC Orders   ED Discharge Orders          Ordered    ondansetron (ZOFRAN-ODT) 4 MG disintegrating tablet  Every 8 hours PRN        12/27/21 1548    ibuprofen (ADVIL) 600 MG tablet  Every 6 hours PRN        12/27/21 1548    potassium chloride SA (KLOR-CON M) 20 MEQ tablet  2 times daily        12/27/21 1548    ferrous sulfate 325 (65 FE) MG tablet  Daily        12/27/21 1548             Note:  This document was prepared using Dragon voice recognition software and may include unintentional dictation errors.   Vanessa [place], MD 12/27/21 1549    Vanessa Ardoch, MD 12/27/21 1550    Vanessa [place], MD 12/27/21 [phone]

## 2021-12-27 NOTE — Discharge Instructions (Addendum)
Your urine looks like there could be a starting infection and it still to be safe I have started you on some antibiotics.  Your blood levels are low and this is secondary to you having low iron most likely.  Take the iron pills.  Potassium is also a little low I am giving you a few pills for supplementation.  Take the ibuprofen with food help with pain and the Zofran to help with any nausea.  We discussed repeat ultrasound today but given you have no pain on 1 side you have elected to hold off.  If develop pain that is consistently on 1 side of your abdomen please return to the ER immediately because this can be an emergency related to your ovary.  However at this time I suspect this is most likely related to your similar pain with menstruation.  You should call your OB/GYN to get a follow-up next week to discuss how you are having this happen again.

## 2021-12-27 NOTE — ED Triage Notes (Signed)
Pt comes via EMS with c/o abdominal pain and menstrual cramps. EMS reports VSS. Pt does have family hx of endometriosis

## 2021-12-28 LAB — URINE CULTURE: Culture: NO GROWTH

## 2022-03-23 ENCOUNTER — Emergency Department (HOSPITAL_COMMUNITY)
Admission: EM | Admit: 2022-03-23 | Discharge: 2022-03-23 | Disposition: A | Discharge status: Home / Self Care | Payer: Self-pay | Attending: Emergency Medicine | Admitting: Emergency Medicine

## 2022-03-23 ENCOUNTER — Encounter (HOSPITAL_COMMUNITY): Payer: Self-pay | Admitting: Emergency Medicine

## 2022-03-23 ENCOUNTER — Other Ambulatory Visit (HOSPITAL_COMMUNITY): Payer: Self-pay

## 2022-03-23 DIAGNOSIS — Z20822 Contact with and (suspected) exposure to covid-19: Secondary | ICD-10-CM | POA: Insufficient documentation

## 2022-03-23 DIAGNOSIS — J069 Acute upper respiratory infection, unspecified: Secondary | ICD-10-CM | POA: Insufficient documentation

## 2022-03-23 LAB — RESP PANEL BY RT-PCR (FLU A&B, COVID) ARPGX2
Influenza A by PCR: NEGATIVE
Influenza B by PCR: NEGATIVE
SARS Coronavirus 2 by RT PCR: NEGATIVE

## 2022-03-23 MED ORDER — DM-GUAIFENESIN ER 30-600 MG PO TB12
1.0000 | ORAL_TABLET | Freq: Two times a day (BID) | ORAL | 0 refills | Status: AC
  Filled 2022-03-23: qty 14, 7d supply, fill #0

## 2022-03-23 MED ORDER — FLUTICASONE PROPIONATE 50 MCG/ACT NA SUSP
2.0000 | Freq: Every day | NASAL | 0 refills | Status: DC
  Filled 2022-03-23: qty 16, 30d supply, fill #0

## 2022-03-23 MED ORDER — ACETAMINOPHEN 500 MG PO TABS
1000.0000 mg | ORAL_TABLET | Freq: Once | ORAL | Status: AC
  Administered 2022-03-23: 1000 mg via ORAL
  Filled 2022-03-23: qty 2

## 2022-03-23 MED ORDER — LORATADINE 10 MG PO TABS
10.0000 mg | ORAL_TABLET | Freq: Every day | ORAL | 0 refills | Status: DC
  Filled 2022-03-23: qty 14, 14d supply, fill #0

## 2022-03-23 NOTE — ED Provider Notes (Signed)
?[facility]-EMERGENCY DEPT ?Provider Note ? ? ?CSN: [id number] ?Arrival date & time: 03/23/22  2947 ? ?  ? ?History ? ?Chief Complaint  ?Patient presents with  ? URI  ? ? ?Barbara Chapman is a 26 y.o. female. ? ? ?URI ?Presenting symptoms: congestion and cough   ?Associated symptoms: myalgias   ? ?25 year old female presenting to the emergency department with roughly 1 day of nasal congestion, nonproductive cough, body aches, headache, sore throat.  The patient states that she thought it was seasonal allergies with initial nasal congestion but with the body aches felt like she was coming down with something.  She denies any fevers.  She denies any unilateral neck swelling or stiffness.  She denies any difficulty phonating, swallowing or breathing. ? ?Home Medications ?Prior to Admission medications   ?Medication Sig Start Date End Date Taking? Authorizing Provider  ?dextromethorphan-guaiFENesin (MUCINEX DM) 30-600 MG 12hr tablet Take 1 tablet by mouth 2 times daily for 7 days. 03/23/22 03/30/22 Yes Ernie Avena, MD  ?fluticasone (FLONASE) 50 MCG/ACT nasal spray Place 2 sprays into both nostrils daily. 03/23/22  Yes Ernie Avena, MD  ?loratadine (CLARITIN) 10 MG tablet Take 1 tablet (10 mg total) by mouth daily for 14 days. 03/23/22 04/06/22 Yes Ernie Avena, MD  ?famotidine (PEPCID) 20 MG tablet Take 1 tablet (20 mg total) by mouth 2 (two) times daily. 10/13/21   Sharman Cheek, MD  ?ferrous sulfate 325 (65 FE) MG tablet Take 1 tablet (325 mg total) by mouth daily. 12/27/21 01/26/22  Concha Se, MD  ?meloxicam (MOBIC) 15 MG tablet Take 1 tablet (15 mg total) by mouth daily. 07/25/21 07/25/22  Fisher, Roselyn Bering, PA-C  ?metoCLOPramide (REGLAN) 10 MG tablet Take 1 tablet (10 mg total) by mouth every 6 (six) hours as needed. 10/13/21   Sharman Cheek, MD  ?potassium chloride SA (KLOR-CON M) 20 MEQ tablet Take 1 tablet (20 mEq total) by mouth 2 (two) times daily for 3 days. 12/27/21 12/30/21  Concha Se,  MD  ?sucralfate (CARAFATE) 1 g tablet Take 1 tablet (1 g total) by mouth 4 (four) times daily. 10/13/21   Sharman Cheek, MD  ?   ? ?Allergies    ?Patient has no known allergies.   ? ?Review of Systems   ?Review of Systems  ?HENT:  Positive for congestion.   ?Respiratory:  Positive for cough.   ?Musculoskeletal:  Positive for myalgias.  ?All other systems reviewed and are negative. ? ?Physical Exam ?Updated Vital Signs ?BP 125/83 (BP Location: Right Arm)   Pulse 72   Temp 98.3 ?F (36.8 ?C) (Oral)   Resp 16   SpO2 100%  ?Physical Exam ?Vitals and nursing note reviewed.  ?Constitutional:   ?   General: She is not in acute distress. ?   Appearance: She is well-developed.  ?HENT:  ?   Head: Normocephalic and atraumatic.  ?   Right Ear: Tympanic membrane normal.  ?   Left Ear: Tympanic membrane normal.  ?   Nose:  ?   Right Turbinates: Enlarged.  ?   Left Turbinates: Enlarged.  ?   Comments: Enlarged nasal turbinates bilaterally ?   Mouth/Throat:  ?   Pharynx: Oropharynx is clear. Uvula midline. Posterior oropharyngeal erythema present. No pharyngeal swelling or oropharyngeal exudate.  ?   Tonsils: No tonsillar exudate or tonsillar abscesses.  ?   Comments: Mild posterior oropharyngeal erythema without exudates ?Eyes:  ?   Conjunctiva/sclera: Conjunctivae normal.  ?Cardiovascular:  ?  Rate and Rhythm: Normal rate and regular rhythm.  ?   Heart sounds: No murmur heard. ?Pulmonary:  ?   Effort: Pulmonary effort is normal. No respiratory distress.  ?   Breath sounds: Normal breath sounds.  ?Abdominal:  ?   Palpations: Abdomen is soft.  ?   Tenderness: There is no abdominal tenderness.  ?Musculoskeletal:     ?   General: No swelling.  ?   Cervical back: Full passive range of motion without pain. No rigidity.  ?Skin: ?   General: Skin is warm and dry.  ?   Capillary Refill: Capillary refill takes less than 2 seconds.  ?Neurological:  ?   Mental Status: She is alert.  ?Psychiatric:     ?   Mood and Affect: Mood normal.   ? ? ?ED Results / Procedures / Treatments   ?Labs ?(all labs ordered are listed, but only abnormal results are displayed) ?Labs Reviewed  ?RESP PANEL BY RT-PCR (FLU A&B, COVID) [id number]  ? ? ?EKG ?None ? ?Radiology ?No results found. ? ?Procedures ?Procedures  ? ? ?Medications Ordered in ED ?Medications  ?acetaminophen (TYLENOL) tablet 1,000 mg (1,000 mg Oral Given 03/23/22 1051)  ? ? ?ED Course/ Medical Decision Making/ A&P ?  ?                        ?Medical Decision Making ?Risk ?OTC drugs. ? ? ?26 year old female presenting to the emergency department with roughly 1 day of nasal congestion, nonproductive cough, body aches, headache, sore throat.  The patient states that she thought it was seasonal allergies with initial nasal congestion but with the body aches felt like she was coming down with something.  She denies any fevers.  She denies any unilateral neck swelling or stiffness.  She denies any difficulty phonating, swallowing or breathing. ? ? ?On arrival, the patient was vitally stable. She presents to the ED with a 1 day history of cough, sore throat, rhinorrhea, and nasal congestion. ? ?On my exam, the patient is well-appearing and well-hydrated.  The patient's lungs are clear to auscultation bilaterally. Additionally, the patient has a soft/non-tender abdomen, clear tympanic membranes, and no oropharyngeal exudates.  There are no signs of meningismus.  I see no signs of an acute bacterial infection. ? ?The patient's presentation is most consistent with a viral upper respiratory infection.  I have a low suspicion for pneumonia as the patient's cough has been non-productive and the patient is neither tachypneic nor hypoxic on room air.  Additionally, the patient is CTAB. ? ?COVID 19 and influenza PCR testing was performed and resulted negative. Symptoms consistent with likely viral URI vs seasonal allergies. ? ?I discussed symptomatic management, including hydration, motrin, and tylenol. The patient felt  safe being discharged from the ED.  They agreed to followup with the PCP if needed.  I provided ED return precautions. ? ?Final Clinical Impression(s) / ED Diagnoses ?Final diagnoses:  ?Upper respiratory tract infection, unspecified type  ? ? ?Rx / DC Orders ?ED Discharge Orders   ? ?      Ordered  ?  loratadine (CLARITIN) 10 MG tablet  Daily       ? 03/23/22 1053  ?  dextromethorphan-guaiFENesin (MUCINEX DM) 30-600 MG 12hr tablet  2 times daily       ? 03/23/22 1053  ?  fluticasone (FLONASE) 50 MCG/ACT nasal spray  Daily       ? 03/23/22 1053  ? ?  ?  ? ?  ? ? ?  ?  Ernie Avena, MD ?03/23/22 1603 ? ?

## 2022-03-23 NOTE — ED Notes (Signed)
I provided reinforced discharge education based off of discharge instructions. Pt acknowledged and understood my education. Pt had no further questions/concerns for provider/myself.  °

## 2022-03-23 NOTE — ED Triage Notes (Signed)
Per pt, states nasal, chest and head congestion, cough, body aches since yesterday-thought it was just allergies but got worse  ?

## 2022-03-23 NOTE — Discharge Instructions (Addendum)
You were evaluated in the Emergency Department and after careful evaluation, we did not find any emergent condition requiring admission or further testing in the hospital. ? ?Your exam/testing today was overall reassuring. Your COVID/flu results will be available on the patient portal online. ? ?Please return to the Emergency Department if you experience any worsening of your condition.  Thank you for allowing [place] to be a part of your care. ? ?

## 2023-02-02 ENCOUNTER — Encounter: Payer: Self-pay | Admitting: Obstetrics and Gynecology

## 2023-02-02 ENCOUNTER — Ambulatory Visit: Payer: Medicaid Other | Admitting: Obstetrics and Gynecology

## 2023-02-02 VITALS — BP 126/78 | HR 101 | Ht 65.0 in | Wt 143.0 lb

## 2023-02-02 DIAGNOSIS — Z30016 Encounter for initial prescription of transdermal patch hormonal contraceptive device: Secondary | ICD-10-CM | POA: Diagnosis not present

## 2023-02-02 DIAGNOSIS — D500 Iron deficiency anemia secondary to blood loss (chronic): Secondary | ICD-10-CM | POA: Diagnosis not present

## 2023-02-02 DIAGNOSIS — N939 Abnormal uterine and vaginal bleeding, unspecified: Secondary | ICD-10-CM

## 2023-02-02 MED ORDER — NORELGESTROMIN-ETH ESTRADIOL 150-35 MCG/24HR TD PTWK: 1.0000 | MEDICATED_PATCH | TRANSDERMAL | 0 refills | Status: DC
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
## 2023-02-02 NOTE — Patient Instructions (Signed)
I value your feedback and you entrusting us with your care. If you get a [facility] patient survey, I would appreciate you taking the time to let us know about your experience today. Thank you! ? ? ?

## 2023-02-03 LAB — CBC WITH DIFFERENTIAL/PLATELET
Basophils Absolute: 0.1 10*3/uL (ref 0.0–0.2)
Basos: 1 %
EOS (ABSOLUTE): 0 10*3/uL (ref 0.0–0.4)
Eos: 0 %
Hematocrit: 30.7 % — ABNORMAL LOW (ref 34.0–46.6)
Hemoglobin: 8.7 g/dL — ABNORMAL LOW (ref 11.1–15.9)
Immature Grans (Abs): 0 10*3/uL (ref 0.0–0.1)
Immature Granulocytes: 0 %
Lymphocytes Absolute: 3.5 10*3/uL — ABNORMAL HIGH (ref 0.7–3.1)
Lymphs: 48 %
MCH: 17.8 pg — ABNORMAL LOW (ref 26.6–33.0)
MCHC: 28.3 g/dL — ABNORMAL LOW (ref 31.5–35.7)
MCV: 63 fL — ABNORMAL LOW (ref 79–97)
Monocytes Absolute: 0.6 10*3/uL (ref 0.1–0.9)
Monocytes: 9 %
Neutrophils Absolute: 3 10*3/uL (ref 1.4–7.0)
Neutrophils: 42 %
Platelets: 530 10*3/uL — ABNORMAL HIGH (ref 150–450)
RBC: 4.9 x10E6/uL (ref 3.77–5.28)
RDW: 19.6 % — ABNORMAL HIGH (ref 11.7–15.4)
WBC: 7.2 10*3/uL (ref 3.4–10.8)

## 2023-02-06 MED ORDER — ACCRUFER 30 MG PO CAPS: 30.0000 mg | ORAL_CAPSULE | Freq: Every day | ORAL | 0 refills | Status: DC

## 2023-02-06 NOTE — Addendum Note (Signed)
Addended by: Ardeth Perfect B on: 0000000 11:10 AM   Modules accepted: Orders

## 2023-04-05 ENCOUNTER — Other Ambulatory Visit: Payer: Self-pay | Admitting: Obstetrics and Gynecology

## 2023-04-05 DIAGNOSIS — Z30016 Encounter for initial prescription of transdermal patch hormonal contraceptive device: Secondary | ICD-10-CM

## 2023-04-05 DIAGNOSIS — D500 Iron deficiency anemia secondary to blood loss (chronic): Secondary | ICD-10-CM

## 2023-04-05 DIAGNOSIS — N939 Abnormal uterine and vaginal bleeding, unspecified: Secondary | ICD-10-CM
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
## 2023-05-02 ENCOUNTER — Ambulatory Visit: Payer: Medicaid Other | Admitting: Obstetrics and Gynecology
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
## 2023-10-14 ENCOUNTER — Encounter (HOSPITAL_BASED_OUTPATIENT_CLINIC_OR_DEPARTMENT_OTHER): Payer: Self-pay

## 2023-10-14 ENCOUNTER — Emergency Department (HOSPITAL_BASED_OUTPATIENT_CLINIC_OR_DEPARTMENT_OTHER): Payer: Medicaid Other

## 2023-10-14 ENCOUNTER — Other Ambulatory Visit: Payer: Self-pay

## 2023-10-14 ENCOUNTER — Emergency Department (HOSPITAL_BASED_OUTPATIENT_CLINIC_OR_DEPARTMENT_OTHER)
Admission: EM | Admit: 2023-10-14 | Discharge: 2023-10-14 | Disposition: A | Discharge status: Home / Self Care | Payer: Medicaid Other | Attending: Emergency Medicine | Admitting: Emergency Medicine

## 2023-10-14 DIAGNOSIS — R112 Nausea with vomiting, unspecified: Secondary | ICD-10-CM | POA: Insufficient documentation

## 2023-10-14 DIAGNOSIS — R1013 Epigastric pain: Secondary | ICD-10-CM | POA: Insufficient documentation

## 2023-10-14 DIAGNOSIS — R197 Diarrhea, unspecified: Secondary | ICD-10-CM | POA: Diagnosis not present

## 2023-10-14 LAB — CBC WITH DIFFERENTIAL/PLATELET
Abs Immature Granulocytes: 0.03 10*3/uL (ref 0.00–0.07)
Basophils Absolute: 0 10*3/uL (ref 0.0–0.1)
Basophils Relative: 0 %
Eosinophils Absolute: 0 10*3/uL (ref 0.0–0.5)
Eosinophils Relative: 0 %
HCT: 31.6 % — ABNORMAL LOW (ref 36.0–46.0)
Hemoglobin: 9.1 g/dL — ABNORMAL LOW (ref 12.0–15.0)
Immature Granulocytes: 0 %
Lymphocytes Relative: 10 %
Lymphs Abs: 1 10*3/uL (ref 0.7–4.0)
MCH: 18.7 pg — ABNORMAL LOW (ref 26.0–34.0)
MCHC: 28.8 g/dL — ABNORMAL LOW (ref 30.0–36.0)
MCV: 64.9 fL — ABNORMAL LOW (ref 80.0–100.0)
Monocytes Absolute: 0.7 10*3/uL (ref 0.1–1.0)
Monocytes Relative: 7 %
Neutro Abs: 8.2 10*3/uL — ABNORMAL HIGH (ref 1.7–7.7)
Neutrophils Relative %: 83 %
Platelets: 576 10*3/uL — ABNORMAL HIGH (ref 150–400)
RBC: 4.87 MIL/uL (ref 3.87–5.11)
RDW: 20.3 % — ABNORMAL HIGH (ref 11.5–15.5)
WBC: 9.9 10*3/uL (ref 4.0–10.5)
nRBC: 0 % (ref 0.0–0.2)

## 2023-10-14 LAB — URINALYSIS, ROUTINE W REFLEX MICROSCOPIC
Bacteria, UA: NONE SEEN
Bilirubin Urine: NEGATIVE
Glucose, UA: NEGATIVE mg/dL
Hgb urine dipstick: NEGATIVE
Ketones, ur: 40 mg/dL — AB
Leukocytes,Ua: NEGATIVE
Nitrite: NEGATIVE
Protein, ur: 30 mg/dL — AB
Specific Gravity, Urine: 1.029 (ref 1.005–1.030)
pH: 8.5 — ABNORMAL HIGH (ref 5.0–8.0)

## 2023-10-14 LAB — COMPREHENSIVE METABOLIC PANEL
ALT: 18 U/L (ref 0–44)
AST: 20 U/L (ref 15–41)
Albumin: 4.7 g/dL (ref 3.5–5.0)
Alkaline Phosphatase: 65 U/L (ref 38–126)
Anion gap: 10 (ref 5–15)
BUN: 10 mg/dL (ref 6–20)
CO2: 23 mmol/L (ref 22–32)
Calcium: 9.4 mg/dL (ref 8.9–10.3)
Chloride: 104 mmol/L (ref 98–111)
Creatinine, Ser: 0.72 mg/dL (ref 0.44–1.00)
GFR, Estimated: >60 mL/min (ref >60)
Glucose, Bld: 93 mg/dL (ref 70–99)
Potassium: 3.7 mmol/L (ref 3.5–5.1)
Sodium: 137 mmol/L (ref 135–145)
Total Bilirubin: 1.1 mg/dL (ref 0.3–1.2)
Total Protein: 7.8 g/dL (ref 6.5–8.1)

## 2023-10-14 LAB — LIPASE, BLOOD: Lipase: 17 U/L (ref 11–51)

## 2023-10-14 LAB — PREGNANCY, URINE: Preg Test, Ur: NEGATIVE

## 2023-10-14 MED ORDER — DROPERIDOL 2.5 MG/ML IJ SOLN
1.2500 mg | Freq: Once | INTRAMUSCULAR | Status: AC
  Administered 2023-10-14: 1.25 mg via INTRAVENOUS
  Filled 2023-10-14: qty 2

## 2023-10-14 MED ORDER — PANTOPRAZOLE SODIUM 20 MG PO TBEC: 20.0000 mg | DELAYED_RELEASE_TABLET | Freq: Every day | ORAL | 0 refills | Status: DC

## 2023-10-14 MED ORDER — DICYCLOMINE HCL 10 MG/ML IM SOLN
20.0000 mg | Freq: Once | INTRAMUSCULAR | Status: AC
  Administered 2023-10-14: 20 mg via INTRAMUSCULAR
  Filled 2023-10-14: qty 2

## 2023-10-14 MED ORDER — ALUM & MAG HYDROXIDE-SIMETH 200-200-20 MG/5ML PO SUSP
15.0000 mL | Freq: Once | ORAL | Status: AC
  Administered 2023-10-14: 15 mL via ORAL
  Filled 2023-10-14: qty 30

## 2023-10-14 MED ORDER — PANTOPRAZOLE SODIUM 40 MG IV SOLR
40.0000 mg | Freq: Once | INTRAVENOUS | Status: AC
  Administered 2023-10-14: 40 mg via INTRAVENOUS
  Filled 2023-10-14: qty 10

## 2023-10-14 MED ORDER — ONDANSETRON 4 MG PO TBDP: 4.0000 mg | ORAL_TABLET | Freq: Three times a day (TID) | ORAL | 0 refills | Status: DC | PRN

## 2023-10-14 MED ORDER — ONDANSETRON 4 MG PO TBDP: 4.0000 mg | ORAL_TABLET | Freq: Once | ORAL | Status: DC

## 2023-10-14 NOTE — ED Notes (Signed)
Discharge paperwork given and verbally understood.... Pts mother requested a paper copy of the medical record... The Pt and the mother were informed that I was unable to provide the copy and that she would have to contact [facility] Medical Records for assistance.Marland KitchenMarland Kitchen

## 2023-10-14 NOTE — Discharge Instructions (Signed)
You were seen in the ER for evaluation of your upper abdominal pain with nausea, vomiting, and diarrhea. I am glad that you are feeling better. For your symptoms, I would like for you to take Protonix for the next two weeks daily. Additionally, I am going to send you in a medication called Zofran for nausea/vomiting that you can take as needed. Please make sure you are staying well hydrated, drinking plenty of fluids, mainly water. I would also advise a bland diet for the next 24-48 hours. Make sure to follow up with your primary care provider about your anemia.  If you have any fever, worsening abdominal pain, worsening nausea, worsening vomiting, black or bloody poop, pain with urination, please return to your nearest emergency department for reevaluation. If you have any concerns, new or worsening symptoms, please return to the nearest ER for re-evaluation.   Contact a doctor if: Your belly pain changes or gets worse. You have very bad cramping or bloating in your belly. You vomit. Your pain gets worse with meals, after eating, or with certain foods. You have trouble pooping or have watery poop for more than 2-3 days. You are not hungry, or you lose weight without trying. You have signs of not getting enough fluid or water (dehydration). These may include: Dark pee, very little pee, or no pee. Cracked lips or dry mouth. Feeling sleepy or weak. You have pain when you pee or poop. Your belly pain wakes you up at night. You have blood in your pee. You have a fever. Get help right away if: You cannot stop vomiting. Your pain is only in one part of your belly, like on the right side. You have bloody or black poop, or poop that looks like tar. You have trouble breathing. You have chest pain. These symptoms may be an emergency. Get help right away. Call 911. Do not wait to see if the symptoms will go away. Do not drive yourself to the hospital.

## 2023-10-14 NOTE — ED Notes (Signed)
Pt and family requested the PA/Provider due to prev note... PA informed.Marland KitchenMarland Kitchen

## 2023-10-14 NOTE — ED Notes (Signed)
IV site appeared to not have any redness/pain or swelling.Marland KitchenMarland Kitchen

## 2023-10-14 NOTE — ED Provider Notes (Signed)
27 year old female who presented to the emergency department with possible food poisoning, nausea/vomiting and abdominal pain.  Labs are reassuring without any acute abnormalities.  Right upper quadrant ultrasound does not reveal any acute pathology.   I became involved in the care when there was concern for a mistake with medication administration.  Bentyl which was appropriately ordered IM was accidentally given IV.  This was made known to [place] immediately by nursing staff, a safety zone report was documented.  We confirmed with pharmacy that the only concern would be local reaction at the IV and that there would be no further complicating issues with the other medication that we had ordered. EKGs show normal Qtc.  This event was discussed in depth with the family by PA and nursing staff.  I had a separate conversation with the family as well to explain the process.  I discussed the concern and symptoms to look out for in regards to a local reaction from the injection.  Otherwise I evaluated the patient at the half-life elimination the patient was experiencing no symptoms.  Patient and family were understanding.  They have requested a printout of the medications that she was given which will be provided in the printout of discharge papers.  They asked for my name and when I went to write it down and they prefer taking a picture of my badge.  I offered further support and they were satisfied at this time.   Rozelle Logan, DO 10/14/23 1610

## 2023-10-14 NOTE — ED Triage Notes (Signed)
Patient arrives with complaints of suspected food poisoning after eating a grilled burger made at home. Now having vomiting/nausea & abdominal pain.

## 2023-10-14 NOTE — ED Provider Notes (Signed)
[place] EMERGENCY DEPARTMENT AT [facility] Provider Note   CSN: [id number] Arrival date & time: 10/14/23  4166     History Chief Complaint  Patient presents with   Abdominal Pain   Diarrhea   Emesis    Barbara Chapman is a 27 y.o. female reportedly otherwise healthy presents to the ER for evaluation of epigastric abdominal pain, nausea, vomiting, and diarrhea since last night after eating a hamburger at home. She reports family had same meal without symptoms. She reports she has had 5 episodes of non bloody, non bilious emesis as well as 3 episodes of non bloody or non black diarrhea. No fevers at home. Denies any chest pain, SOB, or urinary symptoms. No medications take prior to arrival for symptoms. Reports daily MJ use.    Abdominal Pain Associated symptoms: diarrhea, nausea and vomiting   Associated symptoms: no chest pain, no chills, no constipation, no dysuria, no fever, no hematuria and no shortness of breath   Diarrhea Associated symptoms: abdominal pain and vomiting   Associated symptoms: no chills and no fever   Emesis Associated symptoms: abdominal pain and diarrhea   Associated symptoms: no chills and no fever        Home Medications Prior to Admission medications   Medication Sig Start Date End Date Taking? Authorizing Provider  ACCRUFER 30 MG CAPS Take 1 capsule (30 mg total) by mouth daily. THIS BRAND ONLY 02/06/23   Copland, Ilona Sorrel, PA-C  famotidine (PEPCID) 20 MG tablet Take 1 tablet (20 mg total) by mouth 2 (two) times daily. Patient not taking: Reported on 02/02/2023 10/13/21   Sharman Cheek, MD  ferrous sulfate 325 (65 FE) MG tablet Take 1 tablet (325 mg total) by mouth daily. 12/27/21 01/26/22  Concha Se, MD  fluticasone (FLONASE) 50 MCG/ACT nasal spray Place 2 sprays into both nostrils daily. Patient not taking: Reported on 02/02/2023 03/23/22   Ernie Avena, MD  loratadine (CLARITIN) 10 MG tablet Take 1 tablet (10 mg total) by mouth  daily for 14 days. 03/23/22 04/06/22  Ernie Avena, MD  metoCLOPramide (REGLAN) 10 MG tablet Take 1 tablet (10 mg total) by mouth every 6 (six) hours as needed. Patient not taking: Reported on 02/02/2023 10/13/21   Sharman Cheek, MD  norelgestromin-ethinyl estradiol Burr Medico) 150-35 MCG/24HR transdermal patch Place 1 patch onto the skin once a week. Apply 1 patch weekly for 3 weeks, then 1 week without patch 02/02/23   Copland, Alicia B, PA-C  potassium chloride SA (KLOR-CON M) 20 MEQ tablet Take 1 tablet (20 mEq total) by mouth 2 (two) times daily for 3 days. 12/27/21 12/30/21  Concha Se, MD  sucralfate (CARAFATE) 1 g tablet Take 1 tablet (1 g total) by mouth 4 (four) times daily. Patient not taking: Reported on 02/02/2023 10/13/21   Sharman Cheek, MD      Allergies    Patient has no known allergies.    Review of Systems   Review of Systems  Constitutional:  Negative for chills and fever.  Respiratory:  Negative for shortness of breath.   Cardiovascular:  Negative for chest pain.  Gastrointestinal:  Positive for abdominal pain, diarrhea, nausea and vomiting. Negative for anal bleeding, blood in stool and constipation.  Genitourinary:  Negative for dysuria and hematuria.  Neurological:  Negative for syncope.    Physical Exam Updated Vital Signs BP 115/69   Pulse 87   Temp 98.8 F (37.1 C) (Oral)   Resp 18   Ht 5\' 5"  (  1.651 m)   Wt 68 kg   SpO2 100%   BMI 24.96 kg/m  Physical Exam Vitals and nursing note reviewed.  Constitutional:      Appearance: She is not toxic-appearing.     Comments: Uncomfortable, but not in acute distress  HENT:     Mouth/Throat:     Mouth: Mucous membranes are moist.  Cardiovascular:     Rate and Rhythm: Normal rate.  Pulmonary:     Effort: Pulmonary effort is normal. No respiratory distress.  Abdominal:     General: Bowel sounds are normal. There is no distension.     Palpations: Abdomen is soft.     Tenderness: There is abdominal  tenderness in the epigastric area. There is no guarding or rebound.  Skin:    General: Skin is warm and dry.  Neurological:     Mental Status: She is alert.     ED Results / Procedures / Treatments   Labs (all labs ordered are listed, but only abnormal results are displayed) Labs Reviewed  CBC WITH DIFFERENTIAL/PLATELET - Abnormal; Notable for the following components:      Result Value   Hemoglobin 9.1 (*)    HCT 31.6 (*)    MCV 64.9 (*)    MCH 18.7 (*)    MCHC 28.8 (*)    RDW 20.3 (*)    Platelets 576 (*)    Neutro Abs 8.2 (*)    All other components within normal limits  URINALYSIS, ROUTINE W REFLEX MICROSCOPIC - Abnormal; Notable for the following components:   pH 8.5 (*)    Ketones, ur 40 (*)    Protein, ur 30 (*)    All other components within normal limits  COMPREHENSIVE METABOLIC PANEL  LIPASE, BLOOD  PREGNANCY, URINE    EKG None  Radiology US Abdomen Limited RUQ (LIVER/GB)  Result Date: 10/14/2023 CLINICAL DATA:  Right upper quadrant abdominal pain EXAM: ULTRASOUND ABDOMEN LIMITED RIGHT UPPER QUADRANT COMPARISON:  None Available. FINDINGS: Gallbladder: No gallstones or wall thickening visualized. No sonographic Murphy sign noted by sonographer. Common bile duct: Diameter: 0.3 cm Liver: No focal lesion identified. Within normal limits in parenchymal echogenicity. Portal vein is patent on color Doppler imaging with normal direction of blood flow towards the liver. Other: None. IMPRESSION: 1. Normal hepatobiliary ultrasound. Electronically Signed   By: Gaylyn Rong M.D.   On: 10/14/2023 12:22    Procedures Procedures   Medications Ordered in ED Medications  ondansetron (ZOFRAN-ODT) disintegrating tablet 4 mg (4 mg Oral Patient Refused/Not Given 10/14/23 1108)  alum & mag hydroxide-simeth (MAALOX/MYLANTA) 200-200-20 MG/5ML suspension 15 mL (15 mLs Oral Given 10/14/23 1102)  dicyclomine (BENTYL) injection 20 mg (20 mg Intramuscular Given 10/14/23 1256)   pantoprazole (PROTONIX) injection 40 mg (40 mg Intravenous Given 10/14/23 1403)  droperidol (INAPSINE) 2.5 MG/ML injection 1.25 mg (1.25 mg Intravenous Given 10/14/23 1356)    ED Course/ Medical Decision Making/ A&P Clinical Course as of 10/14/23 1755  Sat Oct 14, 2023  1223 The patient declined the Zofran because she wasn't feelin nausea, but now is vomiting. Will order droperidol.   [RR]    Clinical Course User Index [RR] Achille Rich, PA-C   Medical Decision Making Amount and/or Complexity of Data Reviewed Labs: ordered. Radiology: ordered.  Risk OTC drugs. Prescription drug management.   27 y.o. female presents to the ER for evaluation of epigastric pain. Differential diagnosis includes but is not limited to PUD, GERD, gastritis, pancreatitis, gastroparesis, malignancy, biliary disease, intestinal ischemia,  esophageal rupture, hepatitis, pregnancy. Vital signs unremarkable. Physical exam as noted above.   Patient is having some epigastric pain and some tenderness without guarding or rebound. Will continue with [place] and labs. GI cocktail and zofran ordered. She has only had three episodes of diarrhea that is not black or bloody, she denies any fevers and is afebrile here. I do not think this is infectious in nature, but is she continues to have diarrhea while here, will add on stool testing.   I independently reviewed and interpreted the patient's labs. Pregnancy test negative. CMP shows no electrolyte or LFT abnormality. Lipase within normal limits. CBC shows no leukocytosis, but does have slightly elevated neutrophil count. Could be acute stress reaction from vomiting. Patient reports she has known anemia. Appears to be around baseline. Urinalysis shows 40 ketones and 30 protein. No significant increase in specific gravity. No bacteria, WBC, leukocytes, or nitrates.   RUQ US unremarkable per radiologist's read.   The patient declined nausea medication as she reports that she  wasn't feeling nausea currently. She did take the Maalox/Mylanta which she reports help with her symptoms.   On re-evaluation, the patient is vomiting. [place] is unremarkable. This is likely gastritis versus cannabinoid hyperemesis syndrome. Will order bentyl, droperidol, and protonix for symptoms. Will obtain EKG prior to administration for QT check.   QT is 364. Droperidol ordered.   Was alerted by nursing staff that the Bentyl that was correctly ordered as IM, was administered as IV. This was made known to [place] immediately and a safety zone report was documented. Charge nurse was aware as well. I called pharmacy promptly who responded back at 1301 via SecureChat with Delmar Landau, [facility]. He consulted his PC for confirmation of potential risks and side effects and reported back. Recommendations are monitoring the IV site for potential thrombophlebitis or irritation at the injection site. Recommends getting another EKG before droperidol just in case. QT within normal limits.   I was alerted by staff that the patient's mother was concerned about the medications and administration. Myself, charge nurse, and Josh EMTP presented into the room to explain the medication administration error as well as discuss the potential risks/side effects. Ensured that a safety zone was filed and will be handled by administration. Patient's family member was on the phone and was inquiring about the medications ordered. I asked the patient if it was ok to disclose her care and information to the family member which she verbally agrees to. I explained the frequent use and success of droperidol in the the ER in the setting with nausea/vomiting and abdominal pain. I assessed the patient's IV site. Non tender to palpation. No erythema or increase in warmth noted. Compartments proximally and distally soft with intact radial pulse. This is an at length discussion with patient, mom, and family member on phone with the aforementioned staff.  Time was left for questions and all questions answer at this time. Will continue to monitor IV site.  My attending assessed the patient at bedside as well after this conversation. Please see additional note.   On multiple re-evaluations by myself and staff. IV site does not appear swollen, irritated or any erythema. The patient is sleeping comfortably on stretcher. Will allow to rest and re-evaluate.   I woke the patient up from sleeping. She reports that she is feeling much better and does not have any abdominal pain or nausea. On repeat abdominal exam, abdomen is soft and non tender to palpation diffusely. Will test with  PO challenge with water and upgrade to crackers.   Patient was able to tolerate PO food sand fluids without emesis, pain, or nausea. She does have ketones in her urine, but is not hypotensive or tachycardic. I do not think she needs emergent IV fluids given the national shortage given stable vital signs and without sepsis or significant dehydration.   The patient is not experiencing any chest pain or SOB, doubt any ACS or esophageal rupture. She denies any black or bloody stools, doubt any PUD. Lipase within normal limits, less likely pancreatitis. Giving reassuring RUQ [place], less likely biliary disease. The patient has not had any diarrhea since being here and remains afebrile, this is likely no infectious diarrhea, but will give return precautions for this. Her abdominal pain has resolved, I do not see a need for additional imaging at this time. Given improvement with medications, this is likely gastritis. Plan to discharge home with Protonix and Zofran.   IV site was evaluated before removal. Non tender, no change in appearance. IV removed by charge nurse. I discussed with the patient the prescriptions being sent with Protonix and Zofran. We discussed bland diet and staying well hydrated.   We discussed the results of the labs/imaging. The plan is bland diet, hydration, take  medication as prescribed/needed, follow up with PCP. We discussed strict return precautions and red flag symptoms. The patient verbalized their understanding and agrees to the plan. The patient is stable and being discharged home in good condition.  I discussed this case with my attending physician who cosigned this note including patient's presenting symptoms, physical exam, and planned diagnostics and interventions. Attending physician stated agreement with plan or made changes to plan which were implemented.   Attending physician assessed patient at bedside.  Portions of this report may have been transcribed using voice recognition software. Every effort was made to ensure accuracy; however, inadvertent computerized transcription errors may be present.   Final Clinical Impression(s) / ED Diagnoses Final diagnoses:  Epigastric pain  Nausea vomiting and diarrhea    Rx / DC Orders ED Discharge Orders          Ordered    pantoprazole (PROTONIX) 20 MG tablet  Daily        10/14/23 1802    ondansetron (ZOFRAN-ODT) 4 MG disintegrating tablet  Every 8 hours PRN        10/14/23 1802              Achille Rich, PA-C 10/14/23 2115    Horton, Clabe Seal, DO 10/15/23 [phone]

## 2023-10-14 NOTE — ED Notes (Addendum)
This Medic was informed of Pharm being consulted at Provider request due to Bentyl being given inadvertently IV.Marland KitchenMarland Kitchen

## 2023-10-14 NOTE — ED Notes (Signed)
IV from right A/C removed. Before and after this removal, her IV site was normal in appearance and 0/10 pain.

## 2023-11-13 DIAGNOSIS — M79672 Pain in left foot: Secondary | ICD-10-CM | POA: Insufficient documentation

## 2023-11-28 DIAGNOSIS — D509 Iron deficiency anemia, unspecified: Secondary | ICD-10-CM | POA: Insufficient documentation

## 2024-02-12 ENCOUNTER — Other Ambulatory Visit: Payer: Self-pay

## 2024-02-12 ENCOUNTER — Encounter (HOSPITAL_COMMUNITY): Payer: Self-pay

## 2024-02-12 ENCOUNTER — Emergency Department (HOSPITAL_COMMUNITY): Payer: No Typology Code available for payment source

## 2024-02-12 ENCOUNTER — Emergency Department (HOSPITAL_COMMUNITY)
Admission: EM | Admit: 2024-02-12 | Discharge: 2024-02-12 | Disposition: A | Discharge status: Home / Self Care | Payer: No Typology Code available for payment source | Attending: Emergency Medicine | Admitting: Emergency Medicine

## 2024-02-12 DIAGNOSIS — M546 Pain in thoracic spine: Secondary | ICD-10-CM | POA: Diagnosis present

## 2024-02-12 DIAGNOSIS — M25561 Pain in right knee: Secondary | ICD-10-CM | POA: Diagnosis not present

## 2024-02-12 DIAGNOSIS — M25562 Pain in left knee: Secondary | ICD-10-CM | POA: Insufficient documentation

## 2024-02-12 MED ORDER — METHOCARBAMOL 500 MG PO TABS: 500.0000 mg | ORAL_TABLET | Freq: Two times a day (BID) | ORAL | 0 refills | Status: DC
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
## 2024-02-12 NOTE — Discharge Instructions (Addendum)

## 2024-09-23 ENCOUNTER — Emergency Department (HOSPITAL_COMMUNITY)
Admission: EM | Admit: 2024-09-23 | Discharge: 2024-09-23 | Discharge status: Against Medical Advice | Attending: Emergency Medicine | Admitting: Emergency Medicine

## 2024-09-23 ENCOUNTER — Other Ambulatory Visit: Payer: Self-pay

## 2024-09-23 DIAGNOSIS — Z5321 Procedure and treatment not carried out due to patient leaving prior to being seen by health care provider: Secondary | ICD-10-CM | POA: Diagnosis not present

## 2024-09-23 DIAGNOSIS — R519 Headache, unspecified: Secondary | ICD-10-CM | POA: Insufficient documentation

## 2024-09-23 DIAGNOSIS — R509 Fever, unspecified: Secondary | ICD-10-CM | POA: Insufficient documentation

## 2024-09-23 DIAGNOSIS — R059 Cough, unspecified: Secondary | ICD-10-CM | POA: Insufficient documentation

## 2024-09-23 DIAGNOSIS — R0981 Nasal congestion: Secondary | ICD-10-CM | POA: Diagnosis not present

## 2024-09-23 LAB — RESP PANEL BY RT-PCR (RSV, FLU A&B, COVID)  RVPGX2
Influenza A by PCR: NEGATIVE
Influenza B by PCR: NEGATIVE
Resp Syncytial Virus by PCR: NEGATIVE
SARS Coronavirus 2 by RT PCR: NEGATIVE

## 2024-09-23 NOTE — ED Provider Triage Note (Signed)
 Emergency Medicine Provider Triage Evaluation Note  Barbara Chapman , a 28 y.o. female  was evaluated in triage.  Pt complains of Cough, fever, headache, nasal congestion for 2 to 3 days.  Sent home from work today due to fever 102.1.  She has not taken anything for the symptoms prior to arrival.  Denies any recent sick contacts.  No recent testing for COVID, flu.  No vaccines this year. Review of Systems  Positive: Cough, fever, headache, nasal congestion Negative:   Physical Exam  BP 126/68 (BP Location: Left Arm)   Pulse 74   Temp 98.2 F (36.8 C) (Oral)   Resp 16   Ht 5' 5 (1.651 m)   Wt 68 kg   SpO2 100%   BMI 24.96 kg/m  Gen:   Awake, no distress   Resp:  Normal effort  MSK:   Moves extremities without difficulty  Other:  Normal breathing, no rhonchi, stridor, rales  Medical Decision Making  Medically screening exam initiated at 4:37 PM.  Appropriate orders placed.  Barbara Chapman was informed that the remainder of the evaluation will be completed by another provider, this initial triage assessment does not replace that evaluation, and the importance of remaining in the ED until their evaluation is complete.  Workup initiated in triage    Rosan Sherlean DEL, [place] 09/23/24 [id number]

## 2024-09-23 NOTE — ED Triage Notes (Signed)
 Patient coming to ED for evaluation of fever, HA, nasal congestion, and cough x a couple of days.  Reports she was sent home from work today due to fever of 102.1.  No reports of sick contacts.

## 2024-09-24 ENCOUNTER — Ambulatory Visit: Admission: EM | Admit: 2024-09-24 | Discharge: 2024-09-24 | Disposition: A | Discharge status: Home / Self Care

## 2024-09-24 ENCOUNTER — Ambulatory Visit (INDEPENDENT_AMBULATORY_CARE_PROVIDER_SITE_OTHER)

## 2024-09-24 DIAGNOSIS — B349 Viral infection, unspecified: Secondary | ICD-10-CM

## 2024-09-24 MED ORDER — AZELASTINE HCL 0.1 % NA SOLN: 1.0000 | Freq: Two times a day (BID) | NASAL | 1 refills | Status: DC

## 2024-09-24 MED ORDER — PROMETHAZINE-DM 6.25-15 MG/5ML PO SYRP: 10.0000 mL | ORAL_SOLUTION | Freq: Three times a day (TID) | ORAL | 0 refills | Status: DC | PRN

## 2024-09-24 NOTE — ED Provider Notes (Signed)
 [facility]  Note:  This document was prepared using Conservation officer, historic buildings and may include unintentional dictation errors.  MRN: [id number] DOB: September 10, 1996  Subjective:   Barbara Chapman is a 27 y.o. female presenting for congestion, fever, body aches since Saturday.  Patient reports that fever was up to 100.2 on Monday patient was sent home from work.  Patient reports that overnight cough began causing chest congestion and dyspnea with coughing.  Patient reports no fever today.  Was seen in [place], ER for triage after being sent home from work yesterday viral testing was completed and all negative.  Patient denies getting COVID-19 or influenza vaccines this season.  Patient denies any current shortness of breath, chest pain, weakness, dizziness.  No current facility-administered medications for this encounter.  Current Outpatient Medications:    azelastine (ASTELIN) 0.1 % nasal spray, Place 1 spray into both nostrils 2 (two) times daily. Use in each nostril as directed, Disp: 30 mL, Rfl: 1   meloxicam  (MOBIC ) 15 MG tablet, Take 15 mg by mouth daily., Disp: , Rfl:    omeprazole (PRILOSEC) 20 MG capsule, Take 20 mg by mouth 2 (two) times daily before a meal., Disp: , Rfl:    promethazine-dextromethorphan  (PROMETHAZINE-DM) 6.25-15 MG/5ML syrup, Take 10 mLs by mouth 3 (three) times daily as needed., Disp: 240 mL, Rfl: 0   Pseudoeph-Doxylamine-DM-APAP (NYQUIL PO), Take by mouth., Disp: , Rfl:    Pseudoephedrine-APAP-DM (DAYQUIL PO), Take by mouth., Disp: , Rfl:    ACCRUFER  30 MG CAPS, Take 1 capsule (30 mg total) by mouth daily. THIS BRAND ONLY, Disp: 90 capsule, Rfl: 0   famotidine  (PEPCID ) 20 MG tablet, Take 1 tablet (20 mg total) by mouth 2 (two) times daily. (Patient not taking: Reported on 02/02/2023), Disp: 60 tablet, Rfl: 0   ferrous sulfate  325 (65 FE) MG tablet, Take 1 tablet (325 mg total) by mouth daily., Disp: 30 tablet, Rfl: 0   fluticasone  (FLONASE ) 50  MCG/ACT nasal spray, Place 2 sprays into both nostrils daily. (Patient not taking: Reported on 02/02/2023), Disp: 16 g, Rfl: 0   loratadine  (CLARITIN ) 10 MG tablet, Take 1 tablet (10 mg total) by mouth daily for 14 days., Disp: 14 tablet, Rfl: 0   methocarbamol  (ROBAXIN ) 500 MG tablet, Take 1 tablet (500 mg total) by mouth 2 (two) times daily., Disp: 20 tablet, Rfl: 0   metoCLOPramide  (REGLAN ) 10 MG tablet, Take 1 tablet (10 mg total) by mouth every 6 (six) hours as needed. (Patient not taking: Reported on 02/02/2023), Disp: 30 tablet, Rfl: 0   norelgestromin -ethinyl estradiol  (XULANE) 150-35 MCG/24HR transdermal patch, Place 1 patch onto the skin once a week. Apply 1 patch weekly for 3 weeks, then 1 week without patch, Disp: 9 patch, Rfl: 0   ondansetron  (ZOFRAN -ODT) 4 MG disintegrating tablet, Take 1 tablet (4 mg total) by mouth every 8 (eight) hours as needed for nausea or vomiting., Disp: 8 tablet, Rfl: 0   pantoprazole  (PROTONIX ) 20 MG tablet, Take 1 tablet (20 mg total) by mouth daily., Disp: 14 tablet, Rfl: 0   potassium chloride  SA (KLOR-CON  M) 20 MEQ tablet, Take 1 tablet (20 mEq total) by mouth 2 (two) times daily for 3 days., Disp: 6 tablet, Rfl: 0   sucralfate  (CARAFATE ) 1 g tablet, Take 1 tablet (1 g total) by mouth 4 (four) times daily. (Patient not taking: Reported on 02/02/2023), Disp: 120 tablet, Rfl: 1   No Known Allergies  Past Medical History:  Diagnosis Date  Dysmenorrhea      Past Surgical History:  Procedure Laterality Date   NO PAST SURGERIES      Family History  Problem Relation Age of Onset   Endometriosis Mother    Hypertension Mother    Hypertension Father    Heart disease Maternal Grandmother    Hypertension Maternal Grandmother    Hypertension Maternal Grandfather    Hypertension Paternal Grandmother    Hypertension Paternal Grandfather    Breast cancer Other        late years   Colon cancer Other    Colon cancer Other     Social History   Tobacco  Use   Smoking status: Every Day    Types: Cigars   Smokeless tobacco: Never  Vaping Use   Vaping status: Never Used  Substance Use Topics   Alcohol use: No   Drug use: Yes    Types: Marijuana    ROS Refer to HPI for ROS details.  Objective:   Vitals: BP 113/74 (BP Location: Left Arm)   Pulse 74   Temp 98.7 F (37.1 C) (Oral)   Resp 20   Ht 5' 5 (1.651 m)   Wt 155 lb (70.3 kg)   LMP 09/05/2024 (Exact Date)   SpO2 98%   BMI 25.79 kg/m   Physical Exam Vitals and nursing note reviewed.  Constitutional:      General: She is not in acute distress.    Appearance: Normal appearance. She is well-developed. She is not ill-appearing or toxic-appearing.  HENT:     Head: Normocephalic and atraumatic.     Nose: Congestion present. No rhinorrhea.     Mouth/Throat:     Mouth: Mucous membranes are moist.     Pharynx: Oropharynx is clear.  Eyes:     Extraocular Movements: Extraocular movements intact.     Conjunctiva/sclera: Conjunctivae normal.  Cardiovascular:     Rate and Rhythm: Normal rate and regular rhythm.     Heart sounds: Normal heart sounds. No murmur heard. Pulmonary:     Effort: Pulmonary effort is normal. No respiratory distress.     Breath sounds: No stridor. Rhonchi present. No wheezing or rales.  Chest:     Chest wall: No tenderness.  Skin:    General: Skin is warm and dry.  Neurological:     General: No focal deficit present.     Mental Status: She is alert and oriented to person, place, and time.  Psychiatric:        Mood and Affect: Mood normal.        Behavior: Behavior normal.     Procedures  Results for orders placed or performed during the hospital encounter of 09/23/24 (from the past 24 hours)  Resp panel by RT-PCR (RSV, Flu A&B, Covid) Anterior Nasal Swab     Status: None   Collection Time: 09/23/24  4:36 PM   Specimen: Anterior Nasal Swab  Result Value Ref Range   SARS Coronavirus 2 by RT PCR NEGATIVE NEGATIVE   Influenza A by PCR  NEGATIVE NEGATIVE   Influenza B by PCR NEGATIVE NEGATIVE   Resp Syncytial Virus by PCR NEGATIVE NEGATIVE    DG Chest 2 View Result Date: 09/24/2024 CLINICAL DATA:  Cough, fever, congestion, shortness of breath EXAM: CHEST - 2 VIEW COMPARISON:  October 12, 2021 FINDINGS: The heart size and mediastinal contours are within normal limits. Both lungs are clear. The visualized skeletal structures are unremarkable. IMPRESSION: No active cardiopulmonary disease. Electronically Signed  By: Michaeline Blanch M.D.   On: 09/24/2024 09:25     Assessment and Plan :     Discharge Instructions       1. Acute viral syndrome (Primary) - DG Chest 2 View x-ray completed in UC shows no acute cardiopulmonary processes, no sign of consolidation or pneumonia. - promethazine-dextromethorphan  (PROMETHAZINE-DM) 6.25-15 MG/5ML syrup; Take 10 mLs by mouth 3 (three) times daily as needed.  Dispense: 240 mL; Refill: 0 - azelastine (ASTELIN) 0.1 % nasal spray; Place 1 spray into both nostrils 2 (two) times daily. Use in each nostril as directed  Dispense: 30 mL; Refill: 1 -Take ibuprofen  or Tylenol  as needed for body aches, headache, fever secondary to viral illness. -Continue to monitor symptoms for any change in severity if there is any escalation of current symptoms or development of new symptoms follow-up in ER for further evaluation and management.       Renleigh Ouellet B Saatvik Thielman   Lucila Klecka, [place], [place] 09/24/24 [phone]

## 2024-09-24 NOTE — ED Triage Notes (Signed)
 Patient reports onset of congestion on Saturday, which persisted through Sunday. By Monday, symptoms worsened with fever (Tmax 102.40F) while at work; was advised to go home. Due to concern, presented to [facility] ED for triage only--COVID-19 test was negative; left after a 3-hour wait without being seen. Self-treated with NyQuil/DayQuil, which provided some relief overnight. This morning developed a new, severe cough associated with chest pain and shortness of breath. Denies fever today. No COVID-19 or seasonal influenza vaccinations received. Has a PCP but has not yet contacted them.

## 2024-09-24 NOTE — Discharge Instructions (Signed)
  1. Acute viral syndrome (Primary) - DG Chest 2 View x-ray completed in UC shows no acute cardiopulmonary processes, no sign of consolidation or pneumonia. - promethazine-dextromethorphan  (PROMETHAZINE-DM) 6.25-15 MG/5ML syrup; Take 10 mLs by mouth 3 (three) times daily as needed.  Dispense: 240 mL; Refill: 0 - azelastine (ASTELIN) 0.1 % nasal spray; Place 1 spray into both nostrils 2 (two) times daily. Use in each nostril as directed  Dispense: 30 mL; Refill: 1 -Take ibuprofen  or Tylenol  as needed for body aches, headache, fever secondary to viral illness. -Continue to monitor symptoms for any change in severity if there is any escalation of current symptoms or development of new symptoms follow-up in ER for further evaluation and management.

## 2024-10-17 ENCOUNTER — Ambulatory Visit: Admission: EM | Admit: 2024-10-17 | Discharge: 2024-10-17 | Disposition: A | Discharge status: Home / Self Care

## 2024-10-17 ENCOUNTER — Encounter: Payer: Self-pay | Admitting: Emergency Medicine

## 2024-10-17 DIAGNOSIS — J029 Acute pharyngitis, unspecified: Secondary | ICD-10-CM

## 2024-10-17 DIAGNOSIS — H6992 Unspecified Eustachian tube disorder, left ear: Secondary | ICD-10-CM

## 2024-10-17 LAB — POCT RAPID STREP A (OFFICE): Rapid Strep A Screen: NEGATIVE

## 2024-10-17 MED ORDER — PREDNISONE 20 MG PO TABS: 40.0000 mg | ORAL_TABLET | Freq: Every day | ORAL | 0 refills | Status: AC

## 2024-10-17 MED ORDER — AZELASTINE HCL 0.1 % NA SOLN: 1.0000 | Freq: Two times a day (BID) | NASAL | 1 refills | Status: DC

## 2024-10-17 NOTE — ED Triage Notes (Addendum)
 Pt reports L ear pain and sore throat x3 days. Denies fevers, chills, nasal congestion, or cough. No med use for symptoms. No sick contacts. Pt reports she is prone to allergies with weather and seasonal changes. Reports aching, sharp pain in L ear that is aggravated with swallowing or opening her mouth.

## 2024-10-17 NOTE — Discharge Instructions (Addendum)
  1. Acute viral pharyngitis (Primary) - POCT rapid strep A complete and UC is negative for strep pharyngitis  2. Eustachian tube dysfunction, left - azelastine (ASTELIN) 0.1 % nasal spray; Place 1 spray into both nostrils 2 (two) times daily. Use in each nostril as directed  Dispense: 30 mL; Refill: 1 - predniSONE (DELTASONE) 20 MG tablet; Take 2 tablets (40 mg total) by mouth daily for 5 days.  Dispense: 10 tablet; Refill: 0 -Continue to monitor symptoms for any change in severity if there is any escalation of current symptoms or development of new symptoms follow-up in ER for further evaluation and management.

## 2024-10-17 NOTE — ED Provider Notes (Signed)
 [facility]  Note:  This document was prepared using Conservation officer, historic buildings and may include unintentional dictation errors.  MRN: [id number] DOB: 1996/09/14  Subjective:   Barbara Chapman is a 28 y.o. female presenting for left-sided ear pain and sore throat x 3 days.  Patient reports she first noticed the sore throat on Monday with associated left ear pain.  Patient denies any fever, nasal congestion, chills, cough, body aches, weakness, shortness of breath, chest pain.  Patient denies taking any over-the-counter medication to treat symptoms prior to arrival in urgent care.  Denies any known sick contacts.  Patient reports that she has a history of seasonal allergies and thought that maybe the change in weather may be affecting her symptoms.  Patient reports increased pain to the left ear and throat with swallowing and opening her mouth.  No current facility-administered medications for this encounter.  Current Outpatient Medications:    azelastine (ASTELIN) 0.1 % nasal spray, Place 1 spray into both nostrils 2 (two) times daily. Use in each nostril as directed, Disp: 30 mL, Rfl: 1   predniSONE (DELTASONE) 20 MG tablet, Take 2 tablets (40 mg total) by mouth daily for 5 days., Disp: 10 tablet, Rfl: 0   ACCRUFER  30 MG CAPS, Take 1 capsule (30 mg total) by mouth daily. THIS BRAND ONLY (Patient not taking: Reported on 10/17/2024), Disp: 90 capsule, Rfl: 0   famotidine  (PEPCID ) 20 MG tablet, Take 1 tablet (20 mg total) by mouth 2 (two) times daily. (Patient not taking: Reported on 02/02/2023), Disp: 60 tablet, Rfl: 0   ferrous sulfate  325 (65 FE) MG tablet, Take 1 tablet (325 mg total) by mouth daily. (Patient not taking: Reported on 10/17/2024), Disp: 30 tablet, Rfl: 0   fluticasone  (FLONASE ) 50 MCG/ACT nasal spray, Place 2 sprays into both nostrils daily. (Patient not taking: Reported on 02/02/2023), Disp: 16 g, Rfl: 0   loratadine  (CLARITIN ) 10 MG tablet, Take 1 tablet (10 mg  total) by mouth daily for 14 days. (Patient not taking: Reported on 10/17/2024), Disp: 14 tablet, Rfl: 0   meloxicam  (MOBIC ) 15 MG tablet, Take 15 mg by mouth daily. (Patient not taking: Reported on 10/17/2024), Disp: , Rfl:    methocarbamol  (ROBAXIN ) 500 MG tablet, Take 1 tablet (500 mg total) by mouth 2 (two) times daily. (Patient not taking: Reported on 10/17/2024), Disp: 20 tablet, Rfl: 0   metoCLOPramide  (REGLAN ) 10 MG tablet, Take 1 tablet (10 mg total) by mouth every 6 (six) hours as needed. (Patient not taking: Reported on 02/02/2023), Disp: 30 tablet, Rfl: 0   norelgestromin -ethinyl estradiol  (XULANE) 150-35 MCG/24HR transdermal patch, Place 1 patch onto the skin once a week. Apply 1 patch weekly for 3 weeks, then 1 week without patch (Patient not taking: Reported on 10/17/2024), Disp: 9 patch, Rfl: 0   omeprazole (PRILOSEC) 20 MG capsule, Take 20 mg by mouth 2 (two) times daily before a meal. (Patient not taking: Reported on 10/17/2024), Disp: , Rfl:    ondansetron  (ZOFRAN -ODT) 4 MG disintegrating tablet, Take 1 tablet (4 mg total) by mouth every 8 (eight) hours as needed for nausea or vomiting. (Patient not taking: Reported on 10/17/2024), Disp: 8 tablet, Rfl: 0   pantoprazole  (PROTONIX ) 20 MG tablet, Take 1 tablet (20 mg total) by mouth daily. (Patient not taking: Reported on 10/17/2024), Disp: 14 tablet, Rfl: 0   potassium chloride  SA (KLOR-CON  M) 20 MEQ tablet, Take 1 tablet (20 mEq total) by mouth 2 (two) times daily for 3 days. (  Patient not taking: Reported on 10/17/2024), Disp: 6 tablet, Rfl: 0   promethazine-dextromethorphan  (PROMETHAZINE-DM) 6.25-15 MG/5ML syrup, Take 10 mLs by mouth 3 (three) times daily as needed. (Patient not taking: Reported on 10/17/2024), Disp: 240 mL, Rfl: 0   Pseudoeph-Doxylamine-DM-APAP (NYQUIL PO), Take by mouth. (Patient not taking: Reported on 10/17/2024), Disp: , Rfl:    Pseudoephedrine-APAP-DM (DAYQUIL PO), Take by mouth. (Patient not taking: Reported on  10/17/2024), Disp: , Rfl:    sucralfate  (CARAFATE ) 1 g tablet, Take 1 tablet (1 g total) by mouth 4 (four) times daily. (Patient not taking: Reported on 02/02/2023), Disp: 120 tablet, Rfl: 1   No Known Allergies  Past Medical History:  Diagnosis Date   Dysmenorrhea      Past Surgical History:  Procedure Laterality Date   NO PAST SURGERIES      Family History  Problem Relation Age of Onset   Endometriosis Mother    Hypertension Mother    Hypertension Father    Heart disease Maternal Grandmother    Hypertension Maternal Grandmother    Hypertension Maternal Grandfather    Hypertension Paternal Grandmother    Hypertension Paternal Grandfather    Breast cancer Other        late years   Colon cancer Other    Colon cancer Other     Social History   Tobacco Use   Smoking status: Every Day    Types: Cigars   Smokeless tobacco: Never  Vaping Use   Vaping status: Never Used  Substance Use Topics   Alcohol use: No   Drug use: Yes    Types: Marijuana    ROS Refer to HPI for ROS details.  Objective:   Vitals: BP 126/82 (BP Location: Left Arm)   Pulse 85   Temp 98.3 F (36.8 C) (Oral)   Resp 18   LMP 10/05/2024 (Exact Date)   SpO2 98%   Physical Exam Vitals and nursing note reviewed.  Constitutional:      General: She is not in acute distress.    Appearance: She is well-developed. She is not ill-appearing or toxic-appearing.  HENT:     Head: Normocephalic and atraumatic.     Left Ear: Tympanic membrane, ear canal and external ear normal.     Nose: No congestion or rhinorrhea.     Mouth/Throat:     Mouth: Mucous membranes are moist.     Pharynx: Oropharynx is clear. Posterior oropharyngeal erythema present. No oropharyngeal exudate.  Cardiovascular:     Rate and Rhythm: Normal rate.  Pulmonary:     Effort: Pulmonary effort is normal. No respiratory distress.  Musculoskeletal:     Cervical back: Tenderness present. No rigidity.  Lymphadenopathy:      Cervical: No cervical adenopathy.  Skin:    General: Skin is warm and dry.  Neurological:     General: No focal deficit present.     Mental Status: She is alert and oriented to person, place, and time.  Psychiatric:        Mood and Affect: Mood normal.        Behavior: Behavior normal.     Procedures  Results for orders placed or performed during the hospital encounter of 10/17/24 (from the past 24 hours)  POCT rapid strep A     Status: Normal   Collection Time: 10/17/24 10:49 AM  Result Value Ref Range   Rapid Strep A Screen Negative Negative    No results found.   Assessment and Plan :  Discharge Instructions       1. Acute viral pharyngitis (Primary) - POCT rapid strep A complete and UC is negative for strep pharyngitis  2. Eustachian tube dysfunction, left - azelastine (ASTELIN) 0.1 % nasal spray; Place 1 spray into both nostrils 2 (two) times daily. Use in each nostril as directed  Dispense: 30 mL; Refill: 1 - predniSONE (DELTASONE) 20 MG tablet; Take 2 tablets (40 mg total) by mouth daily for 5 days.  Dispense: 10 tablet; Refill: 0 -Continue to monitor symptoms for any change in severity if there is any escalation of current symptoms or development of new symptoms follow-up in ER for further evaluation and management.       Naiomy Watters B Dodd Schmid   Danita Proud, [place], [place] 10/17/24 1141

## 2024-11-27 ENCOUNTER — Encounter: Payer: Self-pay | Admitting: *Deleted

## 2024-11-27 ENCOUNTER — Ambulatory Visit
Admission: EM | Admit: 2024-11-27 | Discharge: 2024-11-27 | Disposition: A | Discharge status: Home / Self Care | Attending: Family Medicine | Admitting: Family Medicine

## 2024-11-27 DIAGNOSIS — J029 Acute pharyngitis, unspecified: Secondary | ICD-10-CM | POA: Insufficient documentation

## 2024-11-27 LAB — POCT RAPID STREP A (OFFICE): Rapid Strep A Screen: NEGATIVE

## 2024-11-27 NOTE — ED Triage Notes (Signed)
 Pt reports sore throat and headache since Monday. Had temp checked at work yesterday and was told she had a fever. She has been taking dayquil and nyquil, last dose yesterday

## 2024-11-27 NOTE — ED Provider Notes (Signed)
 [facility]   [id number] 11/27/24 Arrival Time: 9182  ASSESSMENT & PLAN:  1. Sore throat     No signs of peritonsillar abscess. Rapid strep negative. Culture sent.   Results for orders placed or performed during the hospital encounter of 11/27/24  POCT rapid strep A   Collection Time: 11/27/24  9:19 AM  Result Value Ref Range   Rapid Strep A Screen Negative Negative   Labs Reviewed  POCT RAPID STREP A (OFFICE) - Normal  CULTURE, GROUP A STREP [facility])    OTC analgesics and throat care as needed     Discharge Instructions      You may use over the counter ibuprofen  or acetaminophen  as needed.  For a sore throat, over the counter products such as [employer] Peroxyl Mouth Sore Rinse or Chloraseptic Sore Throat Spray may provide some temporary relief. Your rapid strep test was negative today. We have sent your throat swab for culture and will let you know of any positive results.      Reviewed expectations re: course of current medical issues. Questions answered. Outlined signs and symptoms indicating need for more acute intervention. Patient verbalized understanding. After Visit Summary given.   SUBJECTIVE:  Barbara Chapman is a 28 y.o. female who reports a sore throat. Pt reports sore throat and headache; x 2-3 days. Had temp checked at work yesterday and was told she had a fever. She has been taking dayquil and nyquil, last dose yesterday Denies cough.   OBJECTIVE:  Vitals:   11/27/24 0904  BP: 116/72  Pulse: 78  Resp: 16  Temp: 98.4 F (36.9 C)  TempSrc: Oral  SpO2: 98%     General appearance: alert; no distress HEENT: throat with moderate erythema and cobblestoning; uvula is midline Neck: supple with FROM; no lymphadenopathy Lungs: speaks full sentences without difficulty; unlabored Abd: soft; non-tender Skin: reveals no rash; warm and dry Psychological: alert and cooperative; normal mood and affect  No Known Allergies  Past Medical  History:  Diagnosis Date   Dysmenorrhea    Social History   Socioeconomic History   Marital status: Single    Spouse name: Not on file   Number of children: Not on file   Years of education: Not on file   Highest education level: Not on file  Occupational History   Not on file  Tobacco Use   Smoking status: Every Day    Types: Cigars   Smokeless tobacco: Never  Vaping Use   Vaping status: Never Used  Substance and Sexual Activity   Alcohol use: No   Drug use: Not Currently    Types: Marijuana   Sexual activity: Not Currently    Birth control/protection: None  Other Topics Concern   Not on file  Social History Narrative   Not on file   Social Drivers of Health   Financial Resource Strain: Not on file  Food Insecurity: Not on file  Transportation Needs: Not on file  Physical Activity: Not on file  Stress: Not on file  Social Connections: Not on file  Intimate Partner Violence: Not on file   Family History  Problem Relation Age of Onset   Endometriosis Mother    Hypertension Mother    Hypertension Father    Heart disease Maternal Grandmother    Hypertension Maternal Grandmother    Hypertension Maternal Grandfather    Hypertension Paternal Grandmother    Hypertension Paternal Grandfather    Breast cancer Other  late years   Colon cancer Other    Colon cancer Other            Rolinda Rogue, MD 11/27/24 1009

## 2024-11-27 NOTE — Discharge Instructions (Signed)
 You may use over the counter ibuprofen or acetaminophen as needed.  For a sore throat, over the counter products such as [employer] Peroxyl Mouth Sore Rinse or Chloraseptic Sore Throat Spray may provide some temporary relief. Your rapid strep test was negative today. We have sent your throat swab for culture and will let you know of any positive results.

## 2024-11-30 LAB — CULTURE, GROUP A STREP (THRC)

## 2024-12-02 ENCOUNTER — Ambulatory Visit (HOSPITAL_COMMUNITY): Payer: Self-pay

## 2024-12-10 ENCOUNTER — Encounter: Payer: Self-pay | Admitting: Emergency Medicine

## 2024-12-10 ENCOUNTER — Other Ambulatory Visit: Payer: Self-pay

## 2024-12-10 ENCOUNTER — Ambulatory Visit: Admission: EM | Admit: 2024-12-10 | Discharge: 2024-12-10 | Disposition: A | Discharge status: Home / Self Care

## 2024-12-10 DIAGNOSIS — J111 Influenza due to unidentified influenza virus with other respiratory manifestations: Secondary | ICD-10-CM | POA: Diagnosis not present
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
## 2024-12-10 NOTE — Discharge Instructions (Addendum)

## 2024-12-10 NOTE — ED Triage Notes (Signed)
 Has taken oscillococcinum  vitami c

## 2024-12-10 NOTE — ED Triage Notes (Signed)
 Friday symptoms started.  Reports fever, runny nose, congestion, overall pain and aching.  Postive flu test at home-flu a - tested on Saturday.  Patient wants a work note for days missed
